# Patient Record
Sex: Female | Born: 1945 | Race: Black or African American | Hispanic: No | Marital: Married | Smoking: Never smoker
Health system: Southern US, Community
[De-identification: ages and names within clinical notes are randomized; demographics above are authoritative.]

## PROBLEM LIST (undated history)

## (undated) DIAGNOSIS — Z800 Family history of malignant neoplasm of digestive organs: Secondary | ICD-10-CM

## (undated) DIAGNOSIS — Z8042 Family history of malignant neoplasm of prostate: Secondary | ICD-10-CM

## (undated) DIAGNOSIS — Z803 Family history of malignant neoplasm of breast: Secondary | ICD-10-CM

## (undated) DIAGNOSIS — K635 Polyp of colon: Secondary | ICD-10-CM

## (undated) DIAGNOSIS — I10 Essential (primary) hypertension: Secondary | ICD-10-CM

## (undated) DIAGNOSIS — J45909 Unspecified asthma, uncomplicated: Secondary | ICD-10-CM

## (undated) DIAGNOSIS — E119 Type 2 diabetes mellitus without complications: Secondary | ICD-10-CM

## (undated) HISTORY — DX: Family history of malignant neoplasm of prostate: Z80.42

## (undated) HISTORY — DX: Family history of malignant neoplasm of breast: Z80.3

## (undated) HISTORY — DX: Family history of malignant neoplasm of digestive organs: Z80.0

## (undated) HISTORY — DX: Polyp of colon: K63.5

---

## 2008-03-07 ENCOUNTER — Ambulatory Visit: Payer: Self-pay | Admitting: Internal Medicine

## 2011-10-19 ENCOUNTER — Ambulatory Visit: Payer: Self-pay

## 2011-10-25 ENCOUNTER — Ambulatory Visit: Payer: Self-pay

## 2013-03-01 DIAGNOSIS — H269 Unspecified cataract: Secondary | ICD-10-CM | POA: Insufficient documentation

## 2013-03-01 DIAGNOSIS — M1710 Unilateral primary osteoarthritis, unspecified knee: Secondary | ICD-10-CM | POA: Insufficient documentation

## 2013-03-01 DIAGNOSIS — H409 Unspecified glaucoma: Secondary | ICD-10-CM | POA: Insufficient documentation

## 2015-07-04 ENCOUNTER — Ambulatory Visit: Payer: Medicare Other

## 2015-07-04 ENCOUNTER — Ambulatory Visit
Admission: EM | Admit: 2015-07-04 | Discharge: 2015-07-04 | Disposition: A | Discharge status: Home / Self Care | Payer: Medicare Other | Attending: Family Medicine | Admitting: Family Medicine

## 2015-07-04 DIAGNOSIS — J45909 Unspecified asthma, uncomplicated: Secondary | ICD-10-CM | POA: Insufficient documentation

## 2015-07-04 DIAGNOSIS — I10 Essential (primary) hypertension: Secondary | ICD-10-CM | POA: Insufficient documentation

## 2015-07-04 DIAGNOSIS — S93401A Sprain of unspecified ligament of right ankle, initial encounter: Secondary | ICD-10-CM

## 2015-07-04 DIAGNOSIS — Z7982 Long term (current) use of aspirin: Secondary | ICD-10-CM | POA: Diagnosis not present

## 2015-07-04 DIAGNOSIS — S82201A Unspecified fracture of shaft of right tibia, initial encounter for closed fracture: Secondary | ICD-10-CM

## 2015-07-04 DIAGNOSIS — E119 Type 2 diabetes mellitus without complications: Secondary | ICD-10-CM | POA: Insufficient documentation

## 2015-07-04 DIAGNOSIS — Y939 Activity, unspecified: Secondary | ICD-10-CM | POA: Diagnosis not present

## 2015-07-04 DIAGNOSIS — S99921A Unspecified injury of right foot, initial encounter: Secondary | ICD-10-CM | POA: Diagnosis present

## 2015-07-04 DIAGNOSIS — Z79899 Other long term (current) drug therapy: Secondary | ICD-10-CM | POA: Insufficient documentation

## 2015-07-04 HISTORY — DX: Essential (primary) hypertension: I10

## 2015-07-04 HISTORY — DX: Type 2 diabetes mellitus without complications: E11.9

## 2015-07-04 HISTORY — DX: Unspecified asthma, uncomplicated: J45.909

## 2015-07-04 NOTE — Discharge Instructions (Signed)
Acute Ankle Sprain with Phase I Rehab An acute ankle sprain is a partial or complete tear in one or more of the ligaments of the ankle due to traumatic injury. The severity of the injury depends on both the number of ligaments sprained and the grade of sprain. There are 3 grades of sprains.   A grade 1 sprain is a mild sprain. There is a slight pull without obvious tearing. There is no loss of strength, and the muscle and ligament are the correct length.  A grade 2 sprain is a moderate sprain. There is tearing of fibers within the substance of the ligament where it connects two bones or two cartilages. The length of the ligament is increased, and there is usually decreased strength.  A grade 3 sprain is a complete rupture of the ligament and is uncommon. In addition to the grade of sprain, there are three types of ankle sprains.  Lateral ankle sprains: This is a sprain of one or more of the three ligaments on the outer side (lateral) of the ankle. These are the most common sprains. Medial ankle sprains: There is one large triangular ligament of the inner side (medial) of the ankle that is susceptible to injury. Medial ankle sprains are less common. Syndesmosis, "high ankle," sprains: The syndesmosis is the ligament that connects the two bones of the lower leg. Syndesmosis sprains usually only occur with very severe ankle sprains. SYMPTOMS  Pain, tenderness, and swelling in the ankle, starting at the side of injury that may progress to the whole ankle and foot with time.  "Pop" or tearing sensation at the time of injury.  Bruising that may spread to the heel.  Impaired ability to walk soon after injury. CAUSES   Acute ankle sprains are caused by trauma placed on the ankle that temporarily forces or pries the anklebone (talus) out of its normal socket.  Stretching or tearing of the ligaments that normally hold the joint in place (usually due to a twisting injury). RISK INCREASES  WITH:  Previous ankle sprain.  Sports in which the foot may land awkwardly (i.e., basketball, volleyball, or soccer) or walking or running on uneven or rough surfaces.  Shoes with inadequate support to prevent sideways motion when stress occurs.  Poor strength and flexibility.  Poor balance skills.  Contact sports. PREVENTION   Warm up and stretch properly before activity.  Maintain physical fitness:  Ankle and leg flexibility, muscle strength, and endurance.  Cardiovascular fitness.  Balance training activities.  Use proper technique and have a coach correct improper technique.  Taping, protective strapping, bracing, or high-top tennis shoes may help prevent injury. Initially, tape is best; however, it loses most of its support function within 10 to 15 minutes.  Wear proper-fitted protective shoes (High-top shoes with taping or bracing is more effective than either alone).  Provide the ankle with support during sports and practice activities for 12 months following injury. PROGNOSIS   If treated properly, ankle sprains can be expected to recover completely; however, the length of recovery depends on the degree of injury.  A grade 1 sprain usually heals enough in 5 to 7 days to allow modified activity and requires an average of 6 weeks to heal completely.  A grade 2 sprain requires 6 to 10 weeks to heal completely.  A grade 3 sprain requires 12 to 16 weeks to heal.  A syndesmosis sprain often takes more than 3 months to heal. RELATED COMPLICATIONS   Frequent recurrence of symptoms may  result in a chronic problem. Appropriately addressing the problem the first time decreases the frequency of recurrence and optimizes healing time. Severity of the initial sprain does not predict the likelihood of later instability.  Injury to other structures (bone, cartilage, or tendon).  A chronically unstable or arthritic ankle joint is a possibility with repeated  sprains. TREATMENT Treatment initially involves the use of ice, medication, and compression bandages to help reduce pain and inflammation. Ankle sprains are usually immobilized in a walking cast or boot to allow for healing. Crutches may be recommended to reduce pressure on the injury. After immobilization, strengthening and stretching exercises may be necessary to regain strength and a full range of motion. Surgery is rarely needed to treat ankle sprains. MEDICATION   Nonsteroidal anti-inflammatory medications, such as aspirin and ibuprofen (do not take for the first 3 days after injury or within 7 days before surgery), or other minor pain relievers, such as acetaminophen, are often recommended. Take these as directed by your caregiver. Contact your caregiver immediately if any bleeding, stomach upset, or signs of an allergic reaction occur from these medications.  Ointments applied to the skin may be helpful.  Pain relievers may be prescribed as necessary by your caregiver. Do not take prescription pain medication for longer than 4 to 7 days. Use only as directed and only as much as you need. HEAT AND COLD  Cold treatment (icing) is used to relieve pain and reduce inflammation for acute and chronic cases. Cold should be applied for 10 to 15 minutes every 2 to 3 hours for inflammation and pain and immediately after any activity that aggravates your symptoms. Use ice packs or an ice massage.  Heat treatment may be used before performing stretching and strengthening activities prescribed by your caregiver. Use a heat pack or a warm soak. SEEK IMMEDIATE MEDICAL CARE IF:   Pain, swelling, or bruising worsens despite treatment.  You experience pain, numbness, discoloration, or coldness in the foot or toes.  New, unexplained symptoms develop (drugs used in treatment may produce side effects.) EXERCISES  PHASE I EXERCISES RANGE OF MOTION (ROM) AND STRETCHING EXERCISES - Ankle Sprain, Acute Phase I,  Weeks 1 to 2 These exercises may help you when beginning to restore flexibility in your ankle. You will likely work on these exercises for the 1 to 2 weeks after your injury. Once your physician, physical therapist, or athletic trainer sees adequate progress, he or she will advance your exercises. While completing these exercises, remember:   Restoring tissue flexibility helps normal motion to return to the joints. This allows healthier, less painful movement and activity.  An effective stretch should be held for at least 30 seconds.  A stretch should never be painful. You should only feel a gentle lengthening or release in the stretched tissue. RANGE OF MOTION - Dorsi/Plantar Flexion  While sitting with your right / left knee straight, draw the top of your foot upwards by flexing your ankle. Then reverse the motion, pointing your toes downward.  Hold each position for __________ seconds.  After completing your first set of exercises, repeat this exercise with your knee bent. Repeat __________ times. Complete this exercise __________ times per day.  RANGE OF MOTION - Ankle Alphabet  Imagine your right / left big toe is a pen.  Keeping your hip and knee still, write out the entire alphabet with your "pen." Make the letters as large as you can without increasing any discomfort. Repeat __________ times. Complete this exercise __________  times per day.  STRENGTHENING EXERCISES - Ankle Sprain, Acute -Phase I, Weeks 1 to 2 These exercises may help you when beginning to restore strength in your ankle. You will likely work on these exercises for 1 to 2 weeks after your injury. Once your physician, physical therapist, or athletic trainer sees adequate progress, he or she will advance your exercises. While completing these exercises, remember:   Muscles can gain both the endurance and the strength needed for everyday activities through controlled exercises.  Complete these exercises as instructed by  your physician, physical therapist, or athletic trainer. Progress the resistance and repetitions only as guided.  You may experience muscle soreness or fatigue, but the pain or discomfort you are trying to eliminate should never worsen during these exercises. If this pain does worsen, stop and make certain you are following the directions exactly. If the pain is still present after adjustments, discontinue the exercise until you can discuss the trouble with your clinician. STRENGTH - Dorsiflexors  Secure a rubber exercise band/tubing to a fixed object (i.e., table, pole) and loop the other end around your right / left foot.  Sit on the floor facing the fixed object. The band/tubing should be slightly tense when your foot is relaxed.  Slowly draw your foot back toward you using your ankle and toes.  Hold this position for __________ seconds. Slowly release the tension in the band and return your foot to the starting position. Repeat __________ times. Complete this exercise __________ times per day.  STRENGTH - Plantar-flexors   Sit with your right / left leg extended. Holding onto both ends of a rubber exercise band/tubing, loop it around the ball of your foot. Keep a slight tension in the band.  Slowly push your toes away from you, pointing them downward.  Hold this position for __________ seconds. Return slowly, controlling the tension in the band/tubing. Repeat __________ times. Complete this exercise __________ times per day.  STRENGTH - Ankle Eversion  Secure one end of a rubber exercise band/tubing to a fixed object (table, pole). Loop the other end around your foot just before your toes.  Place your fists between your knees. This will focus your strengthening at your ankle.  Drawing the band/tubing across your opposite foot, slowly, pull your little toe out and up. Make sure the band/tubing is positioned to resist the entire motion.  Hold this position for __________ seconds. Have  your muscles resist the band/tubing as it slowly pulls your foot back to the starting position.  Repeat __________ times. Complete this exercise __________ times per day.  STRENGTH - Ankle Inversion  Secure one end of a rubber exercise band/tubing to a fixed object (table, pole). Loop the other end around your foot just before your toes.  Place your fists between your knees. This will focus your strengthening at your ankle.  Slowly, pull your big toe up and in, making sure the band/tubing is positioned to resist the entire motion.  Hold this position for __________ seconds.  Have your muscles resist the band/tubing as it slowly pulls your foot back to the starting position. Repeat __________ times. Complete this exercises __________ times per day.  STRENGTH - Towel Curls  Sit in a chair positioned on a non-carpeted surface.  Place your right / left foot on a towel, keeping your heel on the floor.  Pull the towel toward your heel by only curling your toes. Keep your heel on the floor.  If instructed by your physician, physical therapist,   or athletic trainer, add weight to the end of the towel. Repeat __________ times. Complete this exercise __________ times per day. Document Released: 06/16/2005 Document Revised: 04/01/2014 Document Reviewed: 02/27/2009 [facility] Patient Information 2015 [place], [place]. This information is not intended to replace advice given to you by your health care provider. Make sure you discuss any questions you have with your health care provider. Tibial Fracture, Adult You have a fracture (break in bone) of your tibia. This is the large "shin" bone in your lower leg. These fractures are easily diagnosed with x-rays. TREATMENT  You have a simple fracture which usually will heal without disability. It can be treated with simple immobilization. This means the bone can be held with a cast or splint in a favorable position until your caregiver feels it is stable  (healed well enough). Then you can begin range of motion exercises to keep your knee and ankle limber (moving well). HOME CARE INSTRUCTIONS   Apply ice to the injury for 15-20 minutes, 03-04 times per day while awake, for 2 days. Put the ice in a plastic bag and place a thin towel between the bag of ice and your cast.  If you have a plaster or fiberglass cast:  Do not try to scratch the skin under the cast using sharp or pointed objects.  Check the skin around the cast every day. You may put lotion on any red or sore areas.  Keep your cast dry and clean.  If you have a plaster splint:  Wear the splint as directed.  You may loosen the elastic around the splint if your toes become numb, tingle, or turn cold or blue.  Do not put pressure on any part of your cast or splint until it is fully hardened.  Your cast or splint can be protected during bathing with a plastic bag. Do not lower the cast or splint into water.  Use crutches as directed.  Only take over-the-counter or prescription medicines for pain, discomfort, or fever as directed by your caregiver.  See your caregiver as directed. It is very important to keep all follow-up referrals and appointments in order to avoid any long-term problems with your leg and ankle including chronic pain, inability to move the ankle normally, failure of the fracture to heal and permanent disability. SEEK IMMEDIATE MEDICAL CARE IF:   Pain is becoming worse rather than better, or if pain is uncontrolled with medications.  You have increased swelling, pain, or redness in the foot.  You begin to lose feeling in your foot or toes.  You develop a cold or blue foot or toes on the injured side.  You develop severe pain in your injured leg, especially if it is increased with movement of your toes. Document Released: 08/10/2001 Document Revised: 02/07/2012 Document Reviewed: 01/09/2014 [facility] Patient Information 2015 [place], [place]. This information  is not intended to replace advice given to you by your health care provider. Make sure you discuss any questions you have with your health care provider.

## 2015-07-04 NOTE — ED Provider Notes (Signed)
CSN: [id number]     Arrival date & time 07/04/15  1308 History   First MD Initiated Contact with Patient 07/04/15 1330     Chief Complaint  Patient presents with  . Foot Injury   (Consider location/radiation/quality/duration/timing/severity/associated sxs/prior Treatment) HPI Comments: Married african [place] female here with spouse for evaluation of rolling right ankle 1 Aug when making the bed.  Has been resting, elevating, soaking in epsom salts, tylenol and OTC pain patch from [employer] providing some relief until she bears weight.  Limping and swelling right ankle.  Hx Dm, asthma, HTN  Patient is a 69 y.o. female presenting with foot injury. The history is provided by the patient and the spouse.  Foot Injury Location:  Ankle Time since incident:  5 days Injury: yes   Ankle location:  R ankle Pain details:    Quality:  Aching   Radiates to:  Does not radiate   Severity:  Moderate   Onset quality:  Sudden   Duration:  5 days   Timing:  Intermittent Chronicity:  New Foreign body present:  No foreign bodies Relieved by:  Rest (epsom soak; OTC pain patch from [employer]) Worsened by:  Bearing weight Ineffective treatments:  Acetaminophen Associated symptoms: decreased ROM, stiffness and swelling   Associated symptoms: no back pain, no fatigue, no fever, no itching, no muscle weakness, no neck pain, no numbness and no tingling   Risk factors: no concern for non-accidental trauma, no frequent fractures, no known bone disorder, no obesity and no recent illness     Past Medical History  Diagnosis Date  . Hypertension   . Asthma   . Diabetes mellitus without complication    History reviewed. No pertinent past surgical history. No family history on file. History  Substance Use Topics  . Smoking status: Never Smoker   . Smokeless tobacco: Not on file  . Alcohol Use: No   OB History    No data available     Review of Systems  Constitutional: Negative for fever, chills,  diaphoresis, activity change, appetite change and fatigue.  HENT: Negative for congestion, dental problem, drooling, ear discharge, ear pain and facial swelling.   Eyes: Negative for photophobia, pain, discharge, redness, itching and visual disturbance.  Respiratory: Negative for cough, shortness of breath, wheezing and stridor.   Cardiovascular: Negative for chest pain, palpitations and leg swelling.  Gastrointestinal: Negative for nausea, vomiting, abdominal pain, diarrhea and constipation.  Endocrine: Negative for cold intolerance and heat intolerance.  Genitourinary: Negative for dysuria, hematuria and difficulty urinating.  Musculoskeletal: Positive for myalgias, joint swelling, gait problem and stiffness. Negative for back pain, arthralgias, neck pain and neck stiffness.  Skin: Negative for color change, itching, pallor, rash and wound.  Allergic/Immunologic: Positive for environmental allergies. Negative for food allergies.  Neurological: Negative for dizziness, tremors, facial asymmetry, weakness and headaches.  Hematological: Negative for adenopathy. Does not bruise/bleed easily.  Psychiatric/Behavioral: Negative for behavioral problems, confusion, sleep disturbance and agitation.    Allergies  Review of patient's allergies indicates no known allergies.  Home Medications   Prior to Admission medications   Medication Sig Start Date End Date Taking? Authorizing Provider  acetaminophen (TYLENOL) 650 MG CR tablet Take 650 mg by mouth every 8 (eight) hours as needed for pain.   Yes Historical Provider, MD  aspirin 81 MG tablet Take 81 mg by mouth daily.   Yes Historical Provider, MD  atenolol (TENORMIN) 25 MG tablet Take by mouth daily.   Yes Historical Provider, MD  Biotin 5000 MCG CAPS Take by mouth.   Yes Historical Provider, MD  fluticasone (FLONASE) 50 MCG/ACT nasal spray Place into both nostrils daily.   Yes Historical Provider, MD  metFORMIN (GLUCOPHAGE) 1000 MG tablet Take 500  mg by mouth 2 (two) times daily with a meal.   Yes Historical Provider, MD  Omega-3 Fatty Acids (FISH OIL) 1200 MG CPDR Take by mouth.   Yes Historical Provider, MD  omeprazole (PRILOSEC) 20 MG capsule Take 20 mg by mouth daily.   Yes Historical Provider, MD  timolol (BETIMOL) 0.25 % ophthalmic solution 1-2 drops 2 (two) times daily.   Yes Historical Provider, MD  triamterene-hydrochlorothiazide (MAXZIDE) 75-50 MG per tablet Take 1 tablet by mouth daily.   Yes Historical Provider, MD  Turmeric Curcumin 500 MG CAPS Take by mouth 2 (two) times daily.   Yes Historical Provider, MD   BP 158/64 mmHg  Pulse 70  Temp(Src) 98.4 F (36.9 C) (Tympanic)  Resp 16  Ht 5\' 2"  (1.575 m)  Wt 201 lb (91.173 kg)  BMI 36.75 kg/m2  SpO2 98% Physical Exam  Constitutional: She is oriented to person, place, and time. Vital signs are normal. She appears well-developed and well-nourished. No distress.  HENT:  Head: Normocephalic and atraumatic.  Right Ear: External ear normal.  Left Ear: External ear normal.  Nose: Nose normal.  Mouth/Throat: Oropharynx is clear and moist. No oropharyngeal exudate.  Eyes: Conjunctivae, EOM and lids are normal. Pupils are equal, round, and reactive to light. Right eye exhibits no discharge. Left eye exhibits no discharge. No scleral icterus.  Neck: Trachea normal and normal range of motion. Neck supple. No tracheal deviation present.  Cardiovascular: Normal rate, regular rhythm and intact distal pulses.   Pulmonary/Chest: Effort normal and breath sounds normal. No stridor. No respiratory distress. She has no wheezes. She has no rales.  Abdominal: Soft. She exhibits no distension.  Musculoskeletal: She exhibits edema and tenderness.       Right hip: Normal.       Left hip: Normal.       Right knee: Normal.       Left knee: Normal.       Right ankle: She exhibits decreased range of motion and swelling. She exhibits no ecchymosis, no deformity, no laceration and normal pulse.  Tenderness. Lateral malleolus tenderness found. No medial malleolus and no proximal fibula tenderness found. Achilles tendon normal. Achilles tendon exhibits no pain, no defect and normal Thompson's test results.       Left ankle: Normal.       Right upper leg: Normal.       Left upper leg: Normal.       Left lower leg: Normal.       Right foot: Normal.       Left foot: Normal.       Feet:  Lymphadenopathy:    She has no cervical adenopathy.  Neurological: She is alert and oriented to person, place, and time. She exhibits normal muscle tone. Coordination normal.  Skin: Skin is warm, dry and intact. No rash noted. She is not diaphoretic. No erythema. No pallor.  Psychiatric: She has a normal mood and affect. Her speech is normal and behavior is normal. Judgment and thought content normal. Cognition and memory are normal.  Nursing note and vitals reviewed.   ED Course  Procedures (including critical care time) Labs Review Labs Reviewed - No data to display  Imaging Review Dg Ankle Complete Right  07/04/2015  CLINICAL DATA:  Twisted right ankle 2 days ago  EXAM: RIGHT ANKLE - COMPLETE 3+ VIEW  COMPARISON:  07/04/2015 right foot  FINDINGS: Three views of the right ankle submitted. There is subtle nondisplaced fracture in distal left fibula. There is soft tissue swelling adjacent to lateral malleolus.  IMPRESSION: Subtle nondisplaced fracture in distal left fibula. Soft tissue swelling adjacent to lateral malleolus.   Electronically Signed   By: Lahoma Crocker M.D.   On: 07/04/2015 14:10   Dg Foot Complete Right  07/04/2015   CLINICAL DATA:  Twisting injury 4 days ago.  Pain  EXAM: RIGHT FOOT COMPLETE - 3+ VIEW  COMPARISON:  None.  FINDINGS: Negative for fracture. Mild degenerative change in the midfoot. Mild degenerative change in the first metatarsal phalangeal joint.  IMPRESSION: Negative for fracture.   Electronically Signed   By: Franchot Gallo M.D.   On: 07/04/2015 14:08     MDM   1.  Ankle sprain, right, initial encounter   2. Tibia fracture, right, closed, initial encounter   Plan: 1. Test/x-ray results and diagnosis reviewed with patient and spouse.  Given copy radiology report 2. rx as per orders; risks, benefits, potential side effects reviewed with patient and spouse 3. Recommend supportive treatment with tylenol, ice, camboot, crutches, elevation, rest 4. F/u prn if symptoms worsen or don't improve. Schedule orthopedics appt [facility]  Patient was instructed to rest, ice and elevate the ankle as much as possible.  Activity as tolerated.  Crutches, limited weight bearing, cam boot.  Discussed results and given copy of radiology report.  Patient is to take acetaminophen 1000mg  po QID prn.  Discussed at risk to reinjure ankle over the next year and to wear supportive footwear/ankle sleeve/ace bandage.  Follow up with orthopedics and [facility] exitcare handout on ankle sprain with rehab exercises and tibia fracture.  Spouse and Patient verbalized agreement and understanding of treatment plan and had no further questions at this time.   P2:  Injury Prevention and Fitness.    Olen Cordial, NP 07/04/15 1428

## 2015-07-04 NOTE — ED Notes (Signed)
Pt states "I twisted my right foot Monday and now I have pain and swelling."

## 2015-07-15 DIAGNOSIS — K2950 Unspecified chronic gastritis without bleeding: Secondary | ICD-10-CM | POA: Insufficient documentation

## 2015-07-15 DIAGNOSIS — B9681 Helicobacter pylori [H. pylori] as the cause of diseases classified elsewhere: Secondary | ICD-10-CM | POA: Insufficient documentation

## 2015-11-13 IMAGING — CR DG ANKLE COMPLETE 3+V*R*
3 series · 3 of 3 positions shown · non-contrast
Comparison: 07/04/2015 right foot

CLINICAL DATA: Twisted right ankle 2 days ago

EXAM:
RIGHT ANKLE - COMPLETE 3+ VIEW

[ankle ap]
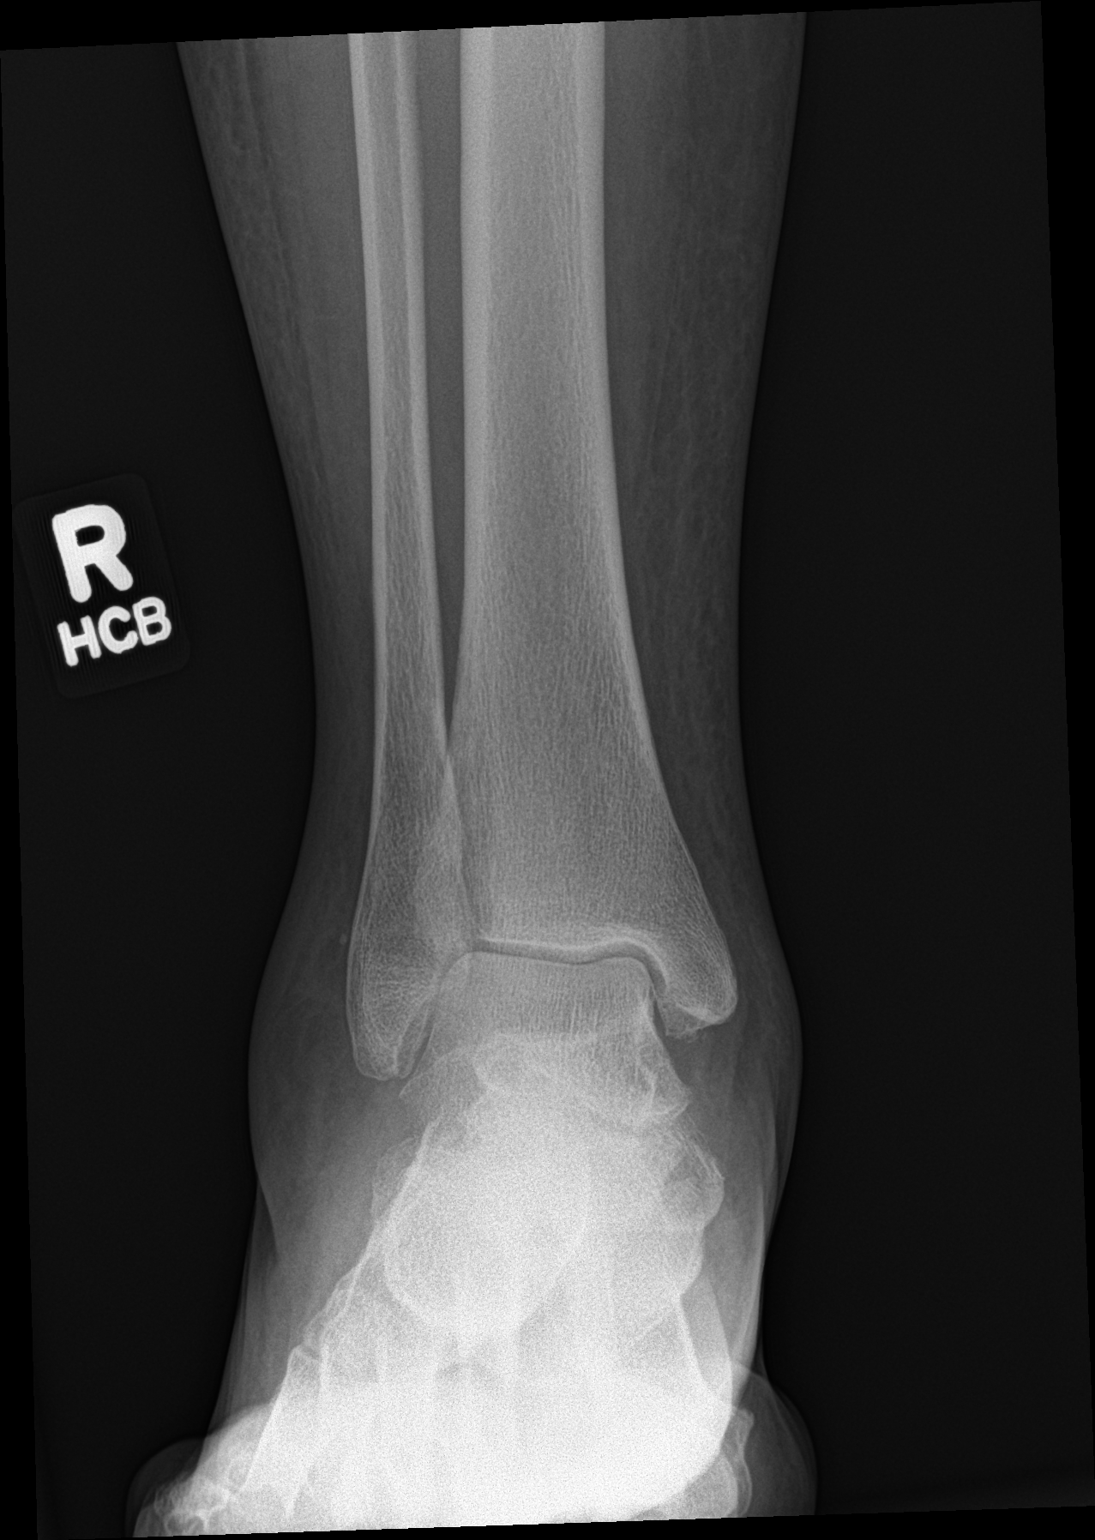

[ankle obl]
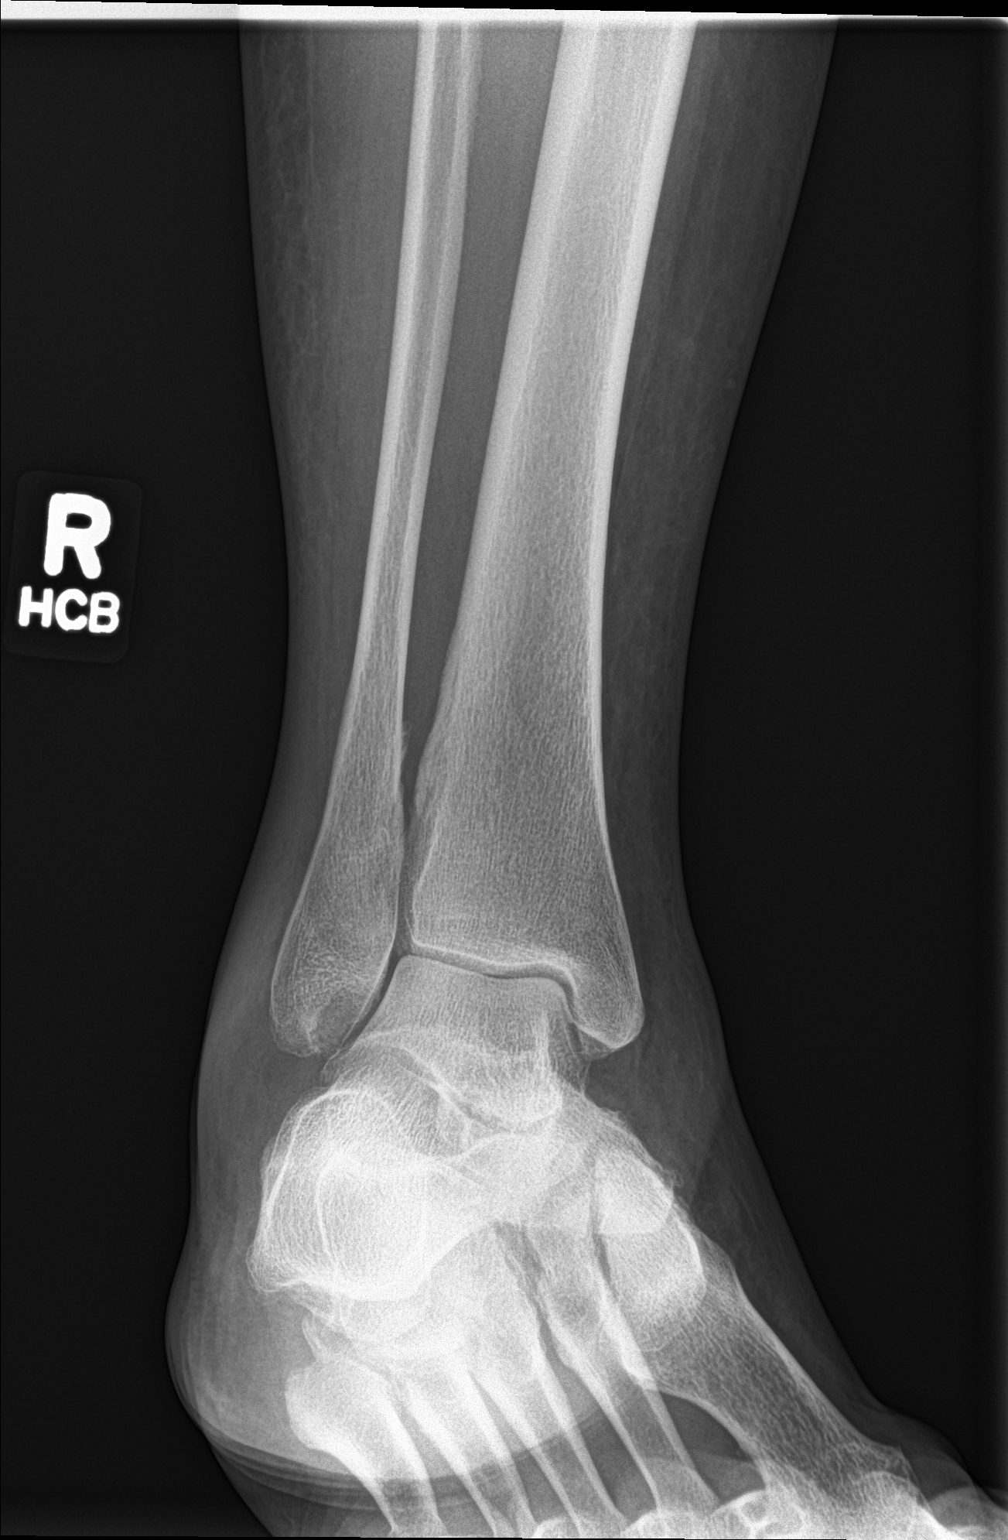

[ankle lat]
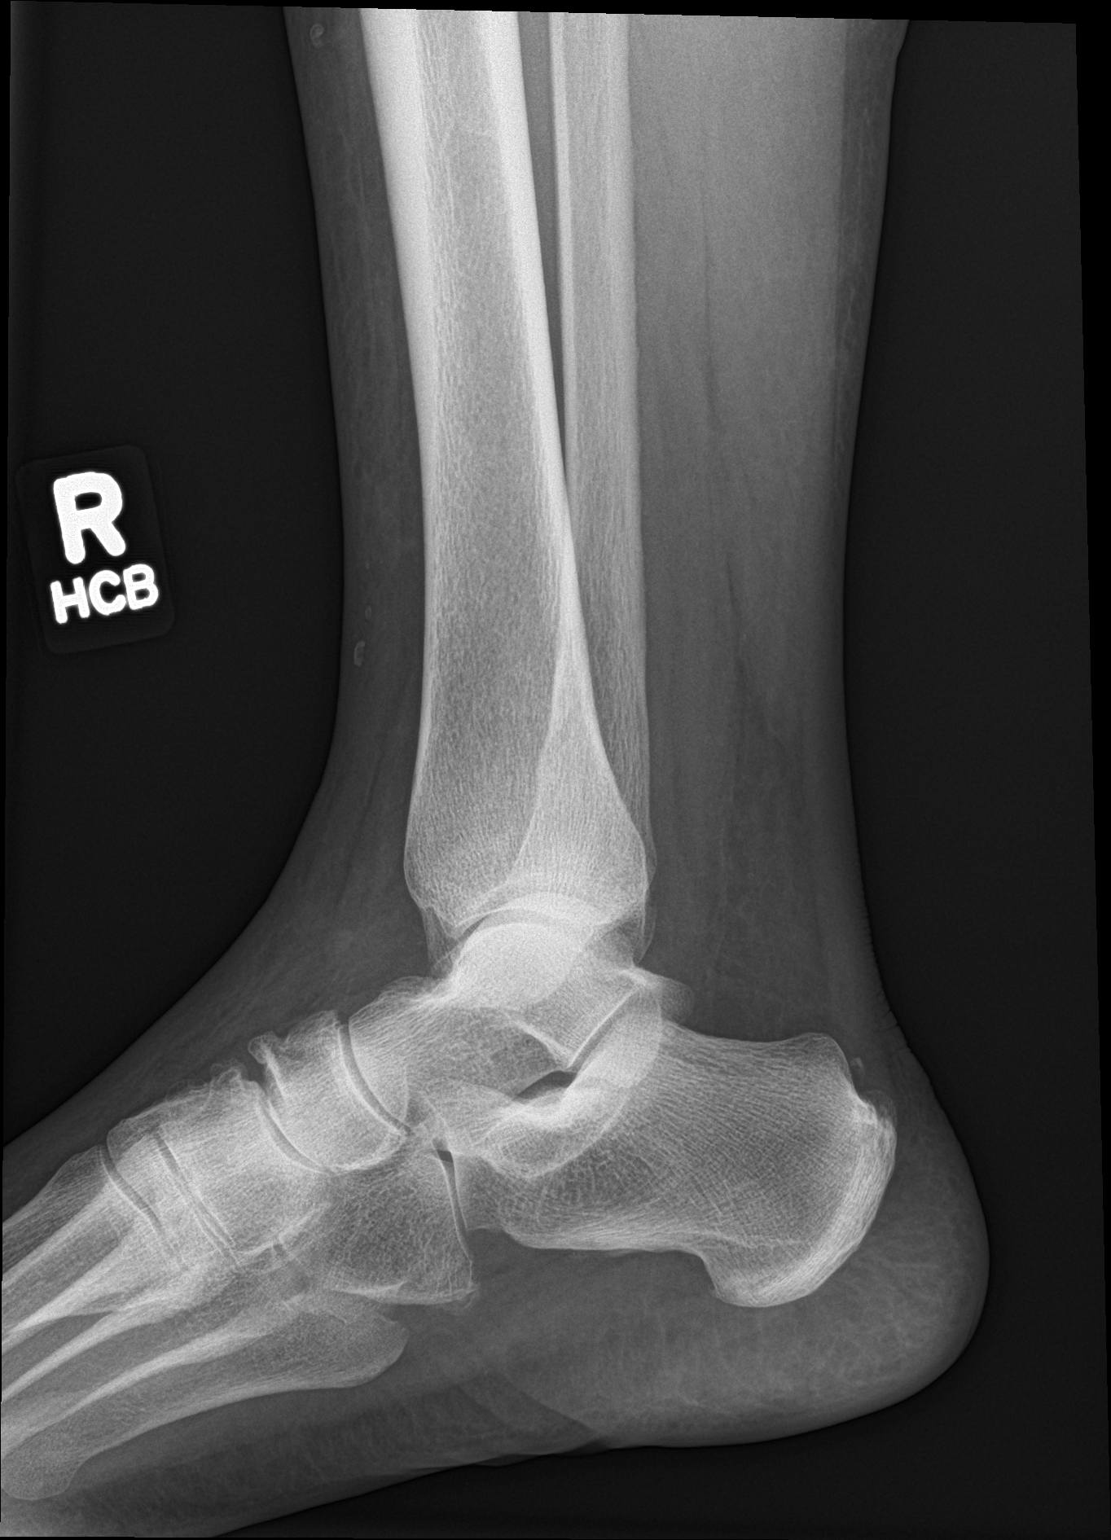

[3 of 3 positions shown; findings below may reference images not displayed]

FINDINGS: Three views of the right ankle submitted. There is subtle
nondisplaced fracture in distal left fibula. There is soft tissue
swelling adjacent to lateral malleolus.
IMPRESSION: Subtle nondisplaced fracture in distal left fibula. Soft tissue
swelling adjacent to lateral malleolus.

## 2015-12-15 ENCOUNTER — Ambulatory Visit
Admission: EM | Admit: 2015-12-15 | Discharge: 2015-12-15 | Disposition: A | Discharge status: Home / Self Care | Payer: Medicare Other | Attending: Family Medicine | Admitting: Family Medicine

## 2015-12-15 ENCOUNTER — Ambulatory Visit (INDEPENDENT_AMBULATORY_CARE_PROVIDER_SITE_OTHER): Payer: Medicare Other

## 2015-12-15 ENCOUNTER — Encounter: Payer: Self-pay | Admitting: Emergency Medicine

## 2015-12-15 DIAGNOSIS — M79672 Pain in left foot: Secondary | ICD-10-CM

## 2015-12-15 NOTE — ED Provider Notes (Signed)
CSN: [id number]     Arrival date & time 12/15/15  1123 History   First MD Initiated Contact with Patient 12/15/15 1332     Chief Complaint  Patient presents with  . Foot Pain   (Consider location/radiation/quality/duration/timing/severity/associated sxs/prior Treatment) HPI  70 year old female presents with left lateral foot swelling and pain for a couple of weeks.. She states that she's had no injury to her foot that she recollects. Is just completed physical therapy on her right foot for fracture that she had and was managed by Dr. Vickki Muff. Since it is continually to become more worse and noticed significant swelling over her inferior malleolus laterally and at the base of her fifth metatarsal.       Past Medical History  Diagnosis Date  . Hypertension   . Asthma   . Diabetes mellitus without complication ([place])    History reviewed. No pertinent past surgical history. History reviewed. No pertinent family history. Social History  Substance Use Topics  . Smoking status: Never Smoker   . Smokeless tobacco: None  . Alcohol Use: No   OB History    No data available     Review of Systems  Constitutional: Positive for activity change. Negative for fever, chills, diaphoresis and fatigue.  Musculoskeletal: Positive for myalgias.  Skin: Negative for color change and pallor.  All other systems reviewed and are negative.   Allergies  Review of patient's allergies indicates no known allergies.  Home Medications   Prior to Admission medications   Medication Sig Start Date End Date Taking? Authorizing Provider  acetaminophen (TYLENOL) 650 MG CR tablet Take 650 mg by mouth every 8 (eight) hours as needed for pain.    Historical Provider, MD  aspirin 81 MG tablet Take 81 mg by mouth daily.    Historical Provider, MD  atenolol (TENORMIN) 25 MG tablet Take by mouth daily.    Historical Provider, MD  Biotin 5000 MCG CAPS Take by mouth.    Historical Provider, MD  fluticasone  (FLONASE) 50 MCG/ACT nasal spray Place into both nostrils daily.    Historical Provider, MD  metFORMIN (GLUCOPHAGE) 1000 MG tablet Take 500 mg by mouth 2 (two) times daily with a meal.    Historical Provider, MD  Omega-3 Fatty Acids (FISH OIL) 1200 MG CPDR Take by mouth.    Historical Provider, MD  omeprazole (PRILOSEC) 20 MG capsule Take 20 mg by mouth daily.    Historical Provider, MD  timolol (BETIMOL) 0.25 % ophthalmic solution 1-2 drops 2 (two) times daily.    Historical Provider, MD  triamterene-hydrochlorothiazide (MAXZIDE) 75-50 MG per tablet Take 1 tablet by mouth daily.    Historical Provider, MD  Turmeric Curcumin 500 MG CAPS Take by mouth 2 (two) times daily.    Historical Provider, MD   Meds Ordered and Administered this Visit  Medications - No data to display  BP 143/58 mmHg  Pulse 60  Temp(Src) 97.3 F (36.3 C) (Tympanic)  Resp 16  Ht 5\' 2"  (1.575 m)  Wt 200 lb (90.719 kg)  BMI 36.57 kg/m2  SpO2 100% No data found.   Physical Exam  Constitutional: She is oriented to person, place, and time. She appears well-developed and well-nourished. No distress.  HENT:  Head: Normocephalic and atraumatic.  Eyes: Conjunctivae are normal. Pupils are equal, round, and reactive to light.  Neck: Normal range of motion. Neck supple.  Musculoskeletal: Normal range of motion. She exhibits edema and tenderness.  Examination of the left foot and ankle reveals  edema over the lateral malleolus that is pitting and also swelling over the base of the fourth and fifth metatarsals. This is somewhat pitting. She is fairly good range of motion preserved today. Maximum tenderness is sharply localized over the base of the fifth metatarsal.  Neurological: She is alert and oriented to person, place, and time.  Skin: Skin is warm and dry. She is not diaphoretic.  Psychiatric: She has a normal mood and affect. Her behavior is normal. Judgment and thought content normal.  Nursing note and vitals  reviewed.   ED Course  Procedures (including critical care time)  Labs Review Labs Reviewed - No data to display  Imaging Review Dg Foot Complete Left  12/15/2015  CLINICAL DATA:  Left foot pain for the month of January, 2017. No recent injury. Initial encounter. EXAM: LEFT FOOT - COMPLETE 3+ VIEW COMPARISON:  None. FINDINGS: No acute bony or joint abnormality is identified. There is some calcaneal spurring present. Mild first MTP degenerative disease is noted. Soft tissues are unremarkable. IMPRESSION: No acute abnormality. Mild first MTP osteoarthritis. Calcaneal spurring. Electronically Signed   By: Inge Rise M.D.   On: 12/15/2015 14:08     Visual Acuity Review  Right Eye Distance:   Left Eye Distance:   Bilateral Distance:    Right Eye Near:   Left Eye Near:    Bilateral Near:         MDM   1. Acute pain of left foot    Plan: 1. Test/x-ray results and diagnosis reviewed with patient 2. rx as per orders; risks, benefits, potential side effects reviewed with patient 3. Recommend supportive treatment with ice elevation as necessary. And has been taken off of NSAIDs that she can only take Tylenol which I have said that that is okay. She states that she has a boot at home from previous injuries and she can use that to help with the pain and allow her to ambulate more freely. She does have an appointment week from today with Dr. Vickki Muff and she will keep that appointment. I have provided her with a disc of the x-rays. The meantime I have asked her to avoid symptoms as much as possible elevate to control the swelling. 4. F/u prn if symptoms worsen or don't improve     Lorin Picket, PA-C 12/15/15 1427

## 2015-12-15 NOTE — ED Notes (Signed)
Patient c/o swelling and pain in her left foot for the past 2 weeks.  Patient denies injury.

## 2016-01-29 ENCOUNTER — Other Ambulatory Visit: Payer: Self-pay | Admitting: Specialist

## 2016-01-29 DIAGNOSIS — M25572 Pain in left ankle and joints of left foot: Secondary | ICD-10-CM

## 2016-02-17 ENCOUNTER — Ambulatory Visit
Admission: RE | Admit: 2016-02-17 | Discharge: 2016-02-17 | Disposition: A | Discharge status: Home / Self Care | Payer: Medicare Other | Source: Ambulatory Visit | Attending: Specialist | Admitting: Specialist

## 2016-02-17 DIAGNOSIS — M25572 Pain in left ankle and joints of left foot: Secondary | ICD-10-CM | POA: Diagnosis not present

## 2016-06-14 ENCOUNTER — Encounter: Payer: Self-pay | Admitting: Genetic Counselor

## 2016-06-14 ENCOUNTER — Other Ambulatory Visit: Payer: Medicare Other

## 2016-06-14 ENCOUNTER — Ambulatory Visit (HOSPITAL_BASED_OUTPATIENT_CLINIC_OR_DEPARTMENT_OTHER): Payer: Self-pay | Admitting: Genetic Counselor

## 2016-06-14 DIAGNOSIS — Z800 Family history of malignant neoplasm of digestive organs: Secondary | ICD-10-CM

## 2016-06-14 DIAGNOSIS — K635 Polyp of colon: Secondary | ICD-10-CM

## 2016-06-14 DIAGNOSIS — Z8042 Family history of malignant neoplasm of prostate: Secondary | ICD-10-CM

## 2016-06-14 DIAGNOSIS — Z803 Family history of malignant neoplasm of breast: Secondary | ICD-10-CM

## 2016-06-14 NOTE — Progress Notes (Signed)
REFERRING PROVIDER: Marcial Pacas, MD [address] [place], [place] [address]  PRIMARY PROVIDER:  Marcial Pacas, MD  PRIMARY REASON FOR VISIT:  1. Colon polyps   2. Family history of stomach cancer   3. Family history of prostate cancer   4. Family history of breast cancer      HISTORY OF PRESENT ILLNESS:   Meagan Martinez, a 70 y.o. female, was seen for a [place] cancer genetics consultation at the request of Dr. Lenna Gilford due to a family history of cancer.  Meagan Martinez presents to clinic today to discuss the possibility of a hereditary predisposition to cancer, genetic testing, and to further clarify her future cancer risks, as well as potential cancer risks for family members.  Meagan Martinez is a 70 y.o. female with no personal history of cancer.  Meagan Martinez daughter was diagnosed with breast cancer and underwent genetic testing.  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  Therefore, testing parents was recommended by the laboratory.  CANCER HISTORY:   No history exists.     HORMONAL RISK FACTORS:  Menarche was at age 25-16.  First live birth at age 40.  OCP use for approximately 5-6 years.  Ovaries intact: yes.  Hysterectomy: no.  Menopausal status: postmenopausal.  HRT use: 0 years. Colonoscopy: yes; approximately 7 colon polyps in her lifetime. Mammogram within the last year: yes. Number of breast biopsies: 1. Up to date with pelvic exams:  yes. Any excessive radiation exposure in the past:  no  Past Medical History  Diagnosis Date  . Hypertension   . Asthma   . Diabetes mellitus without complication ([place])   . Family history of breast cancer   . Family history of prostate cancer   . Family history of stomach cancer   . Colon polyps     History  reviewed. No pertinent past surgical history.  Social History   Social History  . Marital Status: Married    Spouse Name: Jeneen Rinks  . Number of Children: 1  . Years of Education: N/A   Social History Main Topics  . Smoking status: Never Smoker   . Smokeless tobacco: None  . Alcohol Use: No  . Drug Use: None  . Sexual Activity: Not Asked   Other Topics Concern  . None   Social History Narrative     FAMILY HISTORY:  We obtained a detailed, 4-generation family history.  Significant diagnoses are listed below: Family History  Problem Relation Age of Onset  . Alzheimer's disease Mother   . Lung cancer Father     smoker  . Prostate cancer Brother   . Stomach cancer Paternal Aunt   . Prostate cancer Brother   . Stomach cancer Cousin     paternal first cousin    The patient has one daughter who has breast cancer.  She has four brothers, one sister and a paternal half sister.  The half sister has died from kidney disease.  Two brothers had prostate cancer.  The patient's mother died from Alzheimer's disease.  She had four maternal half siblings who were all cancer free.  Her father died from lung cancer.  He was a smoker.  He had three sisters.  One had stomach cancer and had a daughter with stomach cancer.  His parents were cancer free, but he had a paternal cousin who had breast cancer.  Patient's maternal ancestors are of [place] and [place] descent, and paternal ancestors are of [place] and [place] descent. There is no reported Ashkenazi Jewish ancestry. There is no known consanguinity.  GENETIC COUNSELING ASSESSMENT: Meagan Martinez is a 70 y.o. female with a personal history of colon polyps and family history of cancer which is somewhat suggestive of a possibly hereditary condition vs sporadic cancer and predisposition to cancer. We, therefore, discussed and recommended the following at today's visit.   DISCUSSION: We discussed that about 5-10% of cancer  is inherited.  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  To help classify this change, the laboratory wanted to test her parents.  Meagan Martinez husband was tested based on his personal history of cancer and family history of cancer, and was negative.  Therefore Meagan Martinez is here to undergo genetic testing.  We discussed that in all likelihood this is being inherited from her, since her husband was negative, however some people may be a de novo mutation. We will look for the presence or absence of this genetic change found in her daughter in her.  We reviewed the characteristics, features and inheritance patterns of hereditary cancer syndromes. We also discussed genetic testing, including the appropriate family members to test, the process of testing, insurance coverage and turn-around-time for results. We discussed the implications of a negative, positive and/or variant of uncertain significant result. We recommended Meagan Martinez pursue genetic testing for the PMS2 targeted testing through the variant testing program.  PLAN: After considering the risks, benefits, and limitations, Meagan Martinez  provided informed consent to pursue genetic testing and the blood sample was sent to [facility] for analysis of the PMS2 gene. Results should be available within approximately 2-3 weeks' time, at which point they will be disclosed by telephone to Meagan Martinez, as will any additional recommendations warranted by these results. Meagan Martinez will receive a summary of her genetic counseling visit and a copy of her results once available. This information will also be available in Epic. We encouraged Ms. Bobak to remain in contact with cancer genetics annually so that we can continuously update the family history and inform her of any changes in cancer genetics and testing that may be of benefit for her family. Meagan Martinez questions  were answered to her satisfaction today. Our contact information was provided should additional questions or concerns arise.  Lastly, we encouraged Meagan Martinez to remain in contact with cancer genetics annually so that we can continuously update the family history and inform her of any changes in cancer genetics and testing that may be of benefit for this family.   Ms.  T[place]on questions were answered to her satisfaction today. Our contact information was provided should additional questions or concerns arise. Thank you for the referral and allowing [place] to share in the care of your patient.   Bodee Lafoe P. Florene Glen, [place], [facility] Certified Genetic Counselor [email or web address] phone: [phone]  The patient was seen for a total of 30 minutes in face-to-face genetic counseling.  This patient was discussed with Drs. Magrinat, Lindi Adie and/or Burr Medico who agrees with the above.    _______________________________________________________________________ For Office Staff:  Number of people involved in session: 2 Was an Intern/ student involved with case: no

## 2016-07-09 ENCOUNTER — Telehealth: Payer: Self-pay | Admitting: Genetic Counselor

## 2016-07-09 ENCOUNTER — Encounter: Payer: Self-pay | Admitting: Genetic Counselor

## 2016-07-09 DIAGNOSIS — Z1379 Encounter for other screening for genetic and chromosomal anomalies: Secondary | ICD-10-CM | POA: Insufficient documentation

## 2016-07-09 NOTE — Telephone Encounter (Signed)
Revealed to daughter that her testing helped reclassify daughter's previously questionable genetic testing to a negative test.

## 2016-10-08 ENCOUNTER — Encounter (HOSPITAL_COMMUNITY): Payer: Self-pay

## 2016-10-19 DIAGNOSIS — I809 Phlebitis and thrombophlebitis of unspecified site: Secondary | ICD-10-CM | POA: Insufficient documentation

## 2017-07-11 ENCOUNTER — Ambulatory Visit (INDEPENDENT_AMBULATORY_CARE_PROVIDER_SITE_OTHER): Payer: Medicare Other

## 2017-07-11 ENCOUNTER — Encounter: Payer: Self-pay | Admitting: Emergency Medicine

## 2017-07-11 ENCOUNTER — Ambulatory Visit
Admission: EM | Admit: 2017-07-11 | Discharge: 2017-07-11 | Disposition: A | Discharge status: Home / Self Care | Payer: Medicare Other | Attending: Family Medicine | Admitting: Family Medicine

## 2017-07-11 DIAGNOSIS — M25572 Pain in left ankle and joints of left foot: Secondary | ICD-10-CM

## 2017-07-11 DIAGNOSIS — M10072 Idiopathic gout, left ankle and foot: Secondary | ICD-10-CM | POA: Diagnosis not present

## 2017-07-11 MED ORDER — HYDROCODONE-ACETAMINOPHEN 5-325 MG PO TABS: ORAL_TABLET | ORAL | 0 refills | Status: AC

## 2017-07-11 MED ORDER — PREDNISONE 20 MG PO TABS: ORAL_TABLET | ORAL | 0 refills | Status: AC

## 2017-07-11 NOTE — ED Provider Notes (Signed)
[facility]    CSN: [id number] Arrival date & time: 07/11/17  0908     History   Chief Complaint Chief Complaint  Patient presents with  . Foot Pain    HPI Meagan Martinez is a 71 y.o. female.   71 yo female with a c/o left ankle pain for 4 days, associated with some mild swelling.  States pain started suddenly without any apparent cause. She does report falling 4 days prior to her ankle hurting but states she did not injure her ankle during that fall and did not have any ankle symptoms afterwards.    The history is provided by the patient.  Foot Pain     Past Medical History:  Diagnosis Date  . Asthma   . Colon polyps   . Diabetes mellitus without complication ([place])   . Family history of breast cancer   . Family history of prostate cancer   . Family history of stomach cancer   . Hypertension     Patient Active Problem List   Diagnosis Date Noted  . Genetic testing 07/09/2016  . Family history of breast cancer   . Family history of prostate cancer   . Family history of stomach cancer   . Colon polyps     History reviewed. No pertinent surgical history.  OB History    No data available       Home Medications    Prior to Admission medications   Medication Sig Start Date End Date Taking? Authorizing Provider  Potassium 99 MG TABS Take by mouth.   Yes [provider]  acetaminophen (TYLENOL) 650 MG CR tablet Take 650 mg by mouth every 8 (eight) hours as needed for pain.    [provider]  aspirin 81 MG tablet Take 81 mg by mouth daily.    [provider]  atenolol (TENORMIN) 25 MG tablet Take by mouth daily.    [provider]  Biotin 5000 MCG CAPS Take by mouth.    [provider]  fluticasone (FLONASE) 50 MCG/ACT nasal spray Place into both nostrils daily.    [provider]  HYDROcodone-acetaminophen (NORCO/VICODIN) 5-325 MG tablet 1-2 tabs po bid prn 07/11/17   Norval Gable, MD    metFORMIN (GLUCOPHAGE) 1000 MG tablet Take 500 mg by mouth 2 (two) times daily with a meal.    [provider]  Omega-3 Fatty Acids (FISH OIL) 1200 MG CPDR Take by mouth.    [provider]  omeprazole (PRILOSEC) 20 MG capsule Take 20 mg by mouth daily.    [provider]  predniSONE (DELTASONE) 20 MG tablet 2 tabs po qd x 5 days 07/11/17   Norval Gable, MD  timolol (BETIMOL) 0.25 % ophthalmic solution 1-2 drops 2 (two) times daily.    [provider]  triamterene-hydrochlorothiazide (MAXZIDE) 75-50 MG per tablet Take 1 tablet by mouth daily.    [provider]  Turmeric Curcumin 500 MG CAPS Take by mouth 2 (two) times daily.    [provider]    Family History Family History  Problem Relation Age of Onset  . Alzheimer's disease Mother   . Lung cancer Father        smoker  . Prostate cancer Brother   . Stomach cancer Paternal Aunt   . Prostate cancer Brother   . Stomach cancer Cousin        paternal first cousin    Social History Social History  Substance Use Topics  .  Smoking status: Never Smoker  . Smokeless tobacco: Never Used  . Alcohol use No     Allergies   Patient has no known allergies.   Review of Systems Review of Systems   Physical Exam Triage Vital Signs ED Triage Vitals  Enc Vitals Group     BP 07/11/17 0958 (!) 149/54     Pulse Rate 07/11/17 0958 72     Resp 07/11/17 0958 16     Temp 07/11/17 0958 98.2 F (36.8 C)     Temp Source 07/11/17 0958 Oral     SpO2 07/11/17 0958 100 %     Weight 07/11/17 0955 210 lb (95.3 kg)     Height 07/11/17 0955 5\' 2"  (1.575 m)     Head Circumference --      Peak Flow --      Pain Score 07/11/17 0955 10     Pain Loc --      Pain Edu? --      Excl. in [place]? --    No data found.   Updated Vital Signs BP (!) 149/54 (BP Location: Left Arm)   Pulse 72   Temp 98.2 F (36.8 C) (Oral)   Resp 16   Ht 5\' 2"  (1.575 m)   Wt 210 lb (95.3 kg)   SpO2 100%   BMI  38.41 kg/m   Visual Acuity Right Eye Distance:   Left Eye Distance:   Bilateral Distance:    Right Eye Near:   Left Eye Near:    Bilateral Near:     Physical Exam  Constitutional: She appears well-developed.  Musculoskeletal:       Left ankle: She exhibits swelling (mild). She exhibits normal range of motion, no ecchymosis, no deformity, no laceration and normal pulse. Tenderness. Lateral malleolus and medial malleolus tenderness found. No AITFL, no CF ligament, no posterior TFL, no head of 5th metatarsal and no proximal fibula tenderness found. Achilles tendon normal.  Nursing note and vitals reviewed.    UC Treatments / Results  Labs (all labs ordered are listed, but only abnormal results are displayed) Labs Reviewed - No data to display  EKG  EKG Interpretation None       Radiology Dg Ankle Complete Left  Result Date: 07/11/2017 CLINICAL DATA:  Pain, swelling left ankle. EXAM: LEFT ANKLE COMPLETE - 3+ VIEW COMPARISON:  MRI 02/17/2016 FINDINGS: No acute bony abnormality. Specifically, no fracture, subluxation, or dislocation. Soft tissues are intact. Joint spaces are maintained. Plantar and posterior calcaneal spurs noted. IMPRESSION: No acute bony abnormality. Electronically Signed   By: Rolm Baptise M.D.   On: 07/11/2017 10:51    Procedures Procedures (including critical care time)  Medications Ordered in UC Medications - No data to display   Initial Impression / Assessment and Plan / UC Course  I have reviewed the triage vital signs and the nursing notes.  Pertinent labs & imaging results that were available during my care of the patient were reviewed by me and considered in my medical decision making (see chart for details).       Final Clinical Impressions(s) / UC Diagnoses   Final diagnoses:  Acute left ankle pain  Acute idiopathic gout of left ankle    New Prescriptions Discharge Medication List as of 07/11/2017 11:12 AM    START taking these  medications   Details  HYDROcodone-acetaminophen (NORCO/VICODIN) 5-325 MG tablet 1-2 tabs po bid prn, Print    predniSONE (DELTASONE) 20 MG tablet 2 tabs po qd  x 5 days, Normal       1. x-ray results and diagnosis reviewed with patient 2. rx as per orders above; reviewed possible side effects, interactions, risks and benefits  3. Recommend supportive treatment with rest, ice  4. Follow-up prn if symptoms worsen or don't improve  Controlled Substance Prescriptions [place] Controlled Substance Registry consulted? No   Norval Gable, MD 07/11/17 [phone]

## 2017-07-11 NOTE — ED Triage Notes (Signed)
Patient states that she fell on Tuesday.  Patient c/o pain in her left ankle pain on Friday.

## 2017-10-03 ENCOUNTER — Encounter: Payer: Self-pay | Admitting: *Deleted

## 2017-10-03 ENCOUNTER — Ambulatory Visit
Admission: EM | Admit: 2017-10-03 | Discharge: 2017-10-03 | Disposition: A | Discharge status: Home / Self Care | Payer: Medicare Other | Attending: Family Medicine | Admitting: Family Medicine

## 2017-10-03 DIAGNOSIS — J0100 Acute maxillary sinusitis, unspecified: Secondary | ICD-10-CM

## 2017-10-03 DIAGNOSIS — R05 Cough: Secondary | ICD-10-CM

## 2017-10-03 MED ORDER — BENZONATATE 100 MG PO CAPS: 100.0000 mg | ORAL_CAPSULE | Freq: Three times a day (TID) | ORAL | 0 refills | Status: AC | PRN

## 2017-10-03 MED ORDER — AMOXICILLIN 875 MG PO TABS: 875.0000 mg | ORAL_TABLET | Freq: Two times a day (BID) | ORAL | 0 refills | Status: AC

## 2017-10-03 NOTE — ED Triage Notes (Signed)
Chest congestion, productive cough-yellow, chills at night,x1 week.

## 2017-10-03 NOTE — ED Provider Notes (Signed)
[facility]    CSN: [id number] Arrival date & time: 10/03/17  1056     History   Chief Complaint Chief Complaint  Patient presents with  . Cough  . Chills    HPI Meagan Martinez is a 71 y.o. female.   The history is provided by the patient.  Cough  Associated symptoms: wheezing   URI  Presenting symptoms: congestion, cough, facial pain and fatigue   Severity:  Moderate Onset quality:  Sudden Duration:  1 week Timing:  Constant Progression:  Worsening Chronicity:  New Relieved by:  Nothing Ineffective treatments:  OTC medications Associated symptoms: sinus pain and wheezing   Risk factors: chronic respiratory disease     Past Medical History:  Diagnosis Date  . Asthma   . Colon polyps   . Diabetes mellitus without complication ([place])   . Family history of breast cancer   . Family history of prostate cancer   . Family history of stomach cancer   . Hypertension     Patient Active Problem List   Diagnosis Date Noted  . Genetic testing 07/09/2016  . Family history of breast cancer   . Family history of prostate cancer   . Family history of stomach cancer   . Colon polyps     History reviewed. No pertinent surgical history.  OB History    No data available       Home Medications    Prior to Admission medications   Medication Sig Start Date End Date Taking? Authorizing Provider  allopurinol (ZYLOPRIM) 300 MG tablet Take 300 mg daily by mouth.   Yes [provider]  amLODipine (NORVASC) 5 MG tablet Take 5 mg daily by mouth.   Yes [provider]  aspirin 81 MG tablet Take 81 mg by mouth daily.   Yes [provider]  atenolol (TENORMIN) 25 MG tablet Take by mouth daily.   Yes [provider]  colchicine 0.6 MG tablet Take 0.6 mg daily by mouth.   Yes [provider]  fluticasone (FLONASE) 50 MCG/ACT nasal spray Place into both nostrils daily.   Yes [provider]  lisinopril  (PRINIVIL,ZESTRIL) 20 MG tablet Take 20 mg daily by mouth.   Yes [provider]  metFORMIN (GLUCOPHAGE-XR) 500 MG 24 hr tablet Take 500 mg daily with breakfast by mouth.   Yes [provider]  montelukast (SINGULAIR) 10 MG tablet Take 10 mg at bedtime by mouth.   Yes [provider]  omeprazole (PRILOSEC) 20 MG capsule Take 20 mg by mouth daily.   Yes [provider]  timolol (BETIMOL) 0.25 % ophthalmic solution 1-2 drops 2 (two) times daily.   Yes [provider]  Turmeric Curcumin 500 MG CAPS Take by mouth 2 (two) times daily.   Yes [provider]  acetaminophen (TYLENOL) 650 MG CR tablet Take 650 mg by mouth every 8 (eight) hours as needed for pain.    [provider]  amoxicillin (AMOXIL) 875 MG tablet Take 1 tablet (875 mg total) 2 (two) times daily by mouth. 10/03/17   Norval Gable, MD  benzonatate (TESSALON) 100 MG capsule Take 1 capsule (100 mg total) 3 (three) times daily as needed by mouth. 10/03/17   Norval Gable, MD  Biotin 5000 MCG CAPS Take by mouth.    [provider]  HYDROcodone-acetaminophen (NORCO/VICODIN) 5-325 MG tablet 1-2 tabs po bid prn 07/11/17   Norval Gable, MD  metFORMIN (GLUCOPHAGE) 1000 MG tablet Take 500 mg  by mouth 2 (two) times daily with a meal.    [provider]  Omega-3 Fatty Acids (FISH OIL) 1200 MG CPDR Take by mouth.    [provider]  Potassium 99 MG TABS Take by mouth.    [provider]  predniSONE (DELTASONE) 20 MG tablet 2 tabs po qd x 5 days 07/11/17   Norval Gable, MD  triamterene-hydrochlorothiazide (MAXZIDE) 75-50 MG per tablet Take 1 tablet by mouth daily.    [provider]    Family History Family History  Problem Relation Age of Onset  . Alzheimer's disease Mother   . Lung cancer Father        smoker  . Prostate cancer Brother   . Stomach cancer Paternal Aunt   . Prostate cancer Brother   . Stomach cancer Cousin         paternal first cousin    Social History Social History   Tobacco Use  . Smoking status: Never Smoker  . Smokeless tobacco: Never Used  Substance Use Topics  . Alcohol use: No  . Drug use: No     Allergies   Patient has no known allergies.   Review of Systems Review of Systems  Constitutional: Positive for fatigue.  HENT: Positive for congestion and sinus pain.   Respiratory: Positive for cough and wheezing.      Physical Exam Triage Vital Signs ED Triage Vitals  Enc Vitals Group     BP 10/03/17 1150 (!) 158/77     Pulse Rate 10/03/17 1150 70     Resp 10/03/17 1150 16     Temp 10/03/17 1150 97.7 F (36.5 C)     Temp Source 10/03/17 1150 Oral     SpO2 10/03/17 1150 100 %     Weight --      Height --      Head Circumference --      Peak Flow --      Pain Score 10/03/17 1219 0     Pain Loc --      Pain Edu? --      Excl. in [place]? --    No data found.  Updated Vital Signs BP (!) 158/77 (BP Location: Left Arm)   Pulse 70   Temp 97.7 F (36.5 C) (Oral)   Resp 16   SpO2 100%   Visual Acuity Right Eye Distance:   Left Eye Distance:   Bilateral Distance:    Right Eye Near:   Left Eye Near:    Bilateral Near:     Physical Exam  Constitutional: She appears well-developed and well-nourished. No distress.  HENT:  Head: Normocephalic and atraumatic.  Right Ear: Tympanic membrane, external ear and ear canal normal.  Left Ear: Tympanic membrane, external ear and ear canal normal.  Nose: Mucosal edema and rhinorrhea present. No nose lacerations, sinus tenderness, nasal deformity, septal deviation or nasal septal hematoma. No epistaxis.  No foreign bodies. Right sinus exhibits maxillary sinus tenderness and frontal sinus tenderness. Left sinus exhibits maxillary sinus tenderness and frontal sinus tenderness.  Mouth/Throat: Uvula is midline, oropharynx is clear and moist and mucous membranes are normal. No oropharyngeal exudate.  Eyes: Conjunctivae and EOM are  normal. Pupils are equal, round, and reactive to light. Right eye exhibits no discharge. Left eye exhibits no discharge. No scleral icterus.  Neck: Normal range of motion. Neck supple. No thyromegaly present.  Cardiovascular: Normal rate, regular rhythm and normal heart sounds.  Pulmonary/Chest: Effort normal and breath sounds normal.  No respiratory distress. She has no wheezes. She has no rales.  Lymphadenopathy:    She has no cervical adenopathy.  Skin: She is not diaphoretic.  Nursing note and vitals reviewed.    UC Treatments / Results  Labs (all labs ordered are listed, but only abnormal results are displayed) Labs Reviewed - No data to display  EKG  EKG Interpretation None       Radiology No results found.  Procedures Procedures (including critical care time)  Medications Ordered in UC Medications - No data to display   Initial Impression / Assessment and Plan / UC Course  I have reviewed the triage vital signs and the nursing notes.  Pertinent labs & imaging results that were available during my care of the patient were reviewed by me and considered in my medical decision making (see chart for details).       Final Clinical Impressions(s) / UC Diagnoses   Final diagnoses:  Acute maxillary sinusitis, recurrence not specified    New Prescriptions This SmartLink is deprecated. Use AVSMEDLIST instead to display the medication list for a patient.   1. diagnosis reviewed with patient 2. rx as per orders above; reviewed possible side effects, interactions, risks and benefits  3. Recommend supportive treatment with  4. Follow-up prn if symptoms worsen or don't improve Controlled Substance Prescriptions [place] Controlled Substance Registry consulted? Not Applicable   Norval Gable, MD 10/03/17 [phone]

## 2017-11-20 IMAGING — CR DG ANKLE COMPLETE 3+V*L*
3 series · 3 of 3 positions shown · non-contrast
Comparison: MRI 02/17/2016

CLINICAL DATA: Pain, swelling left ankle.

EXAM:
LEFT ANKLE COMPLETE - 3+ VIEW

[ankle ap]
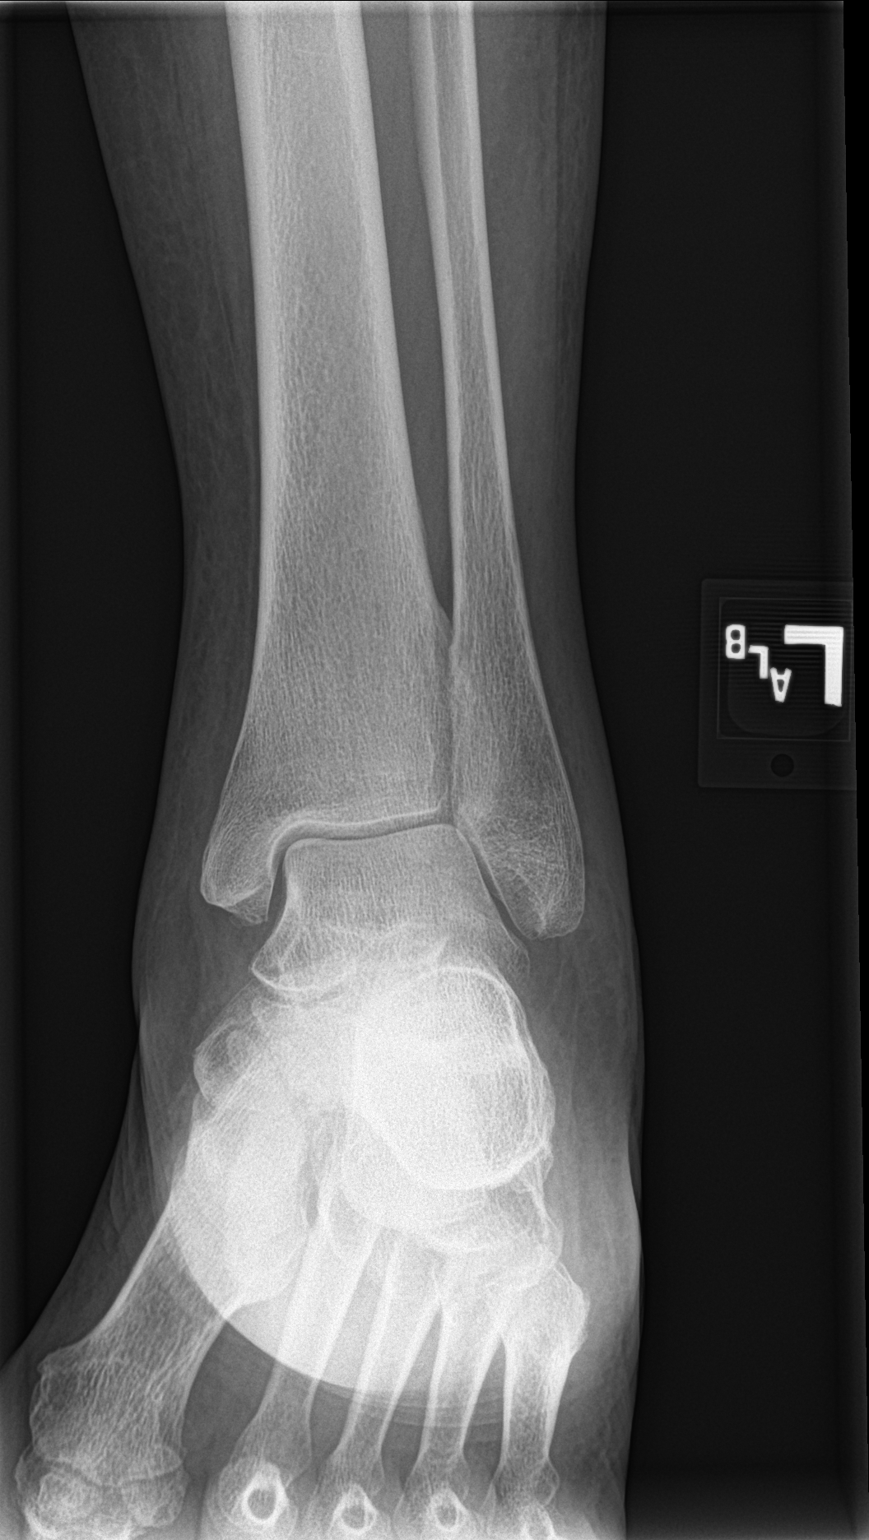

[ankle obl]
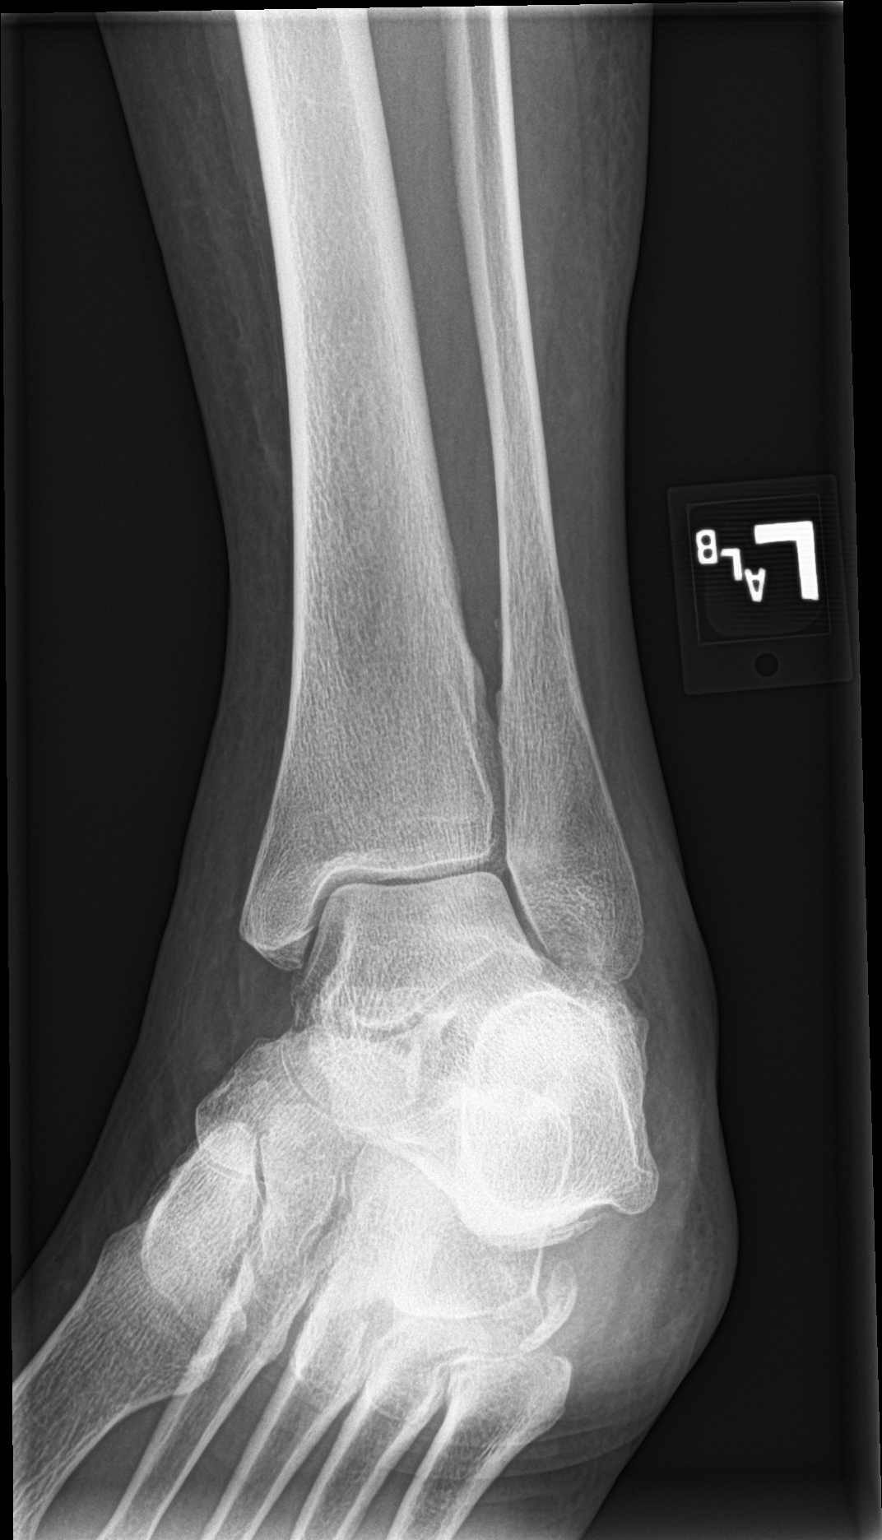

[ankle lat]
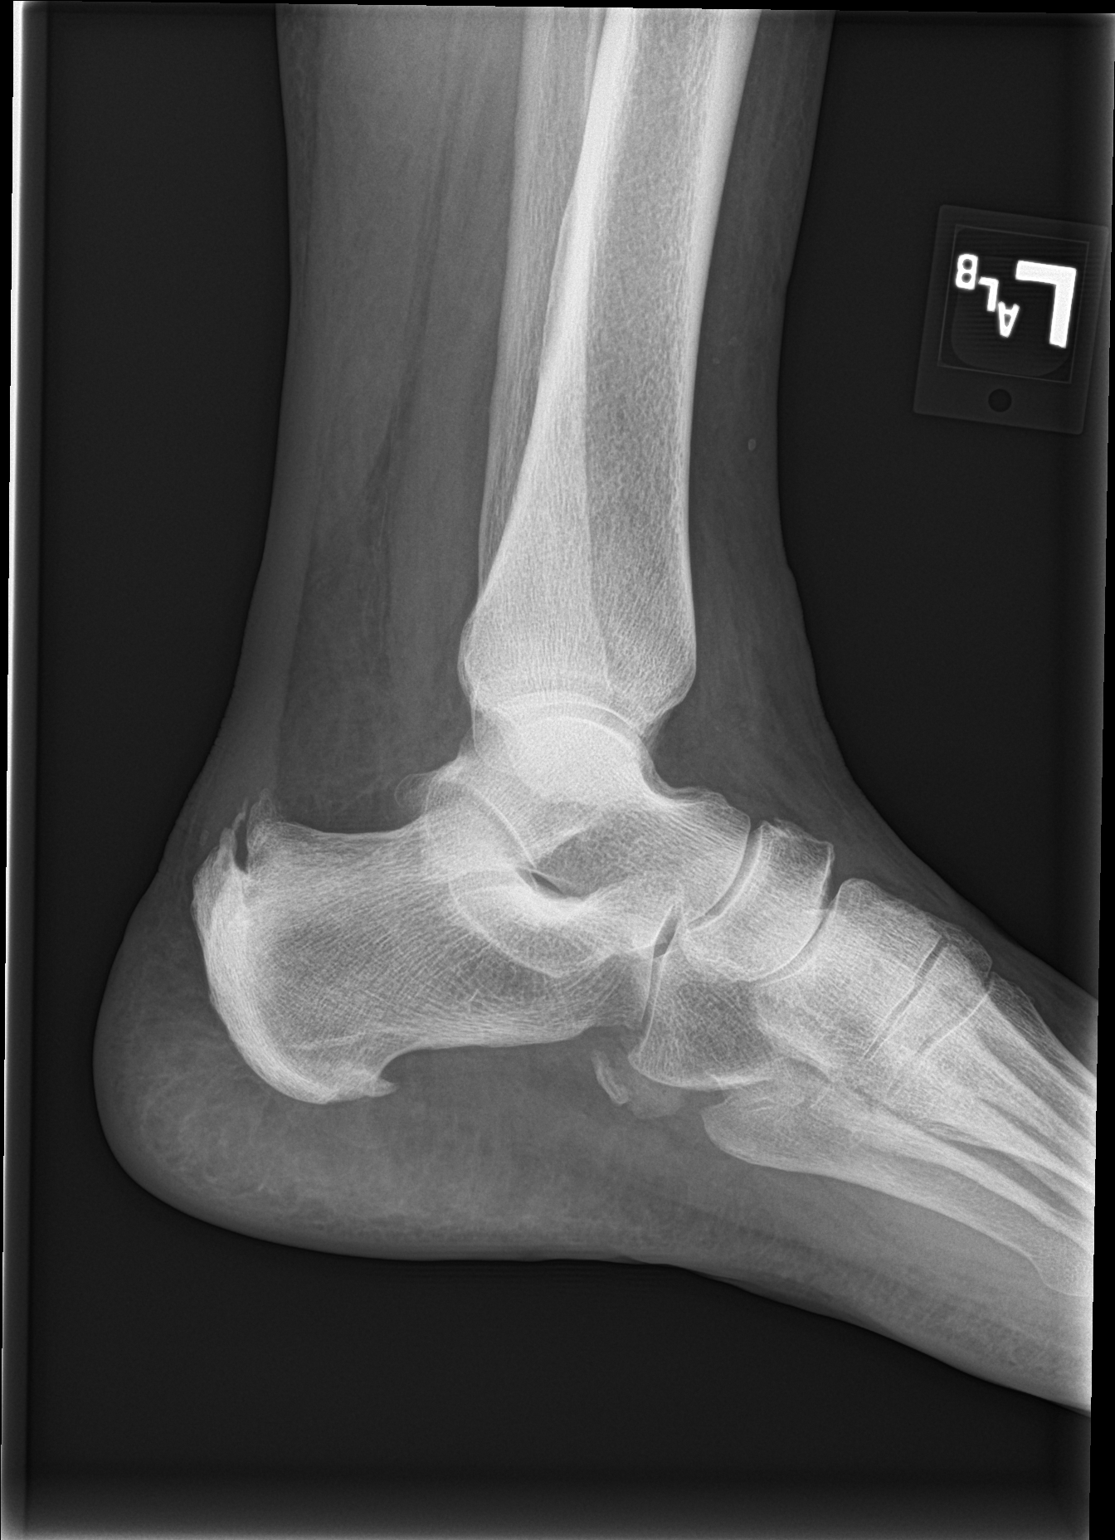

[3 of 3 positions shown; findings below may reference images not displayed]

FINDINGS: No acute bony abnormality. Specifically, no fracture, subluxation,
or dislocation. Soft tissues are intact. Joint spaces are
maintained. Plantar and posterior calcaneal spurs noted.
IMPRESSION: No acute bony abnormality.

## 2017-12-05 DIAGNOSIS — M1A29X0 Drug-induced chronic gout, multiple sites, without tophus (tophi): Secondary | ICD-10-CM | POA: Insufficient documentation

## 2019-05-03 DIAGNOSIS — Z86010 Personal history of colonic polyps: Secondary | ICD-10-CM | POA: Insufficient documentation

## 2019-05-07 DIAGNOSIS — K5790 Diverticulosis of intestine, part unspecified, without perforation or abscess without bleeding: Secondary | ICD-10-CM | POA: Insufficient documentation

## 2019-10-26 DIAGNOSIS — R001 Bradycardia, unspecified: Secondary | ICD-10-CM | POA: Insufficient documentation

## 2019-10-26 DIAGNOSIS — I495 Sick sinus syndrome: Secondary | ICD-10-CM | POA: Insufficient documentation

## 2019-11-15 DIAGNOSIS — Z950 Presence of cardiac pacemaker: Secondary | ICD-10-CM | POA: Insufficient documentation

## 2021-08-06 DIAGNOSIS — R600 Localized edema: Secondary | ICD-10-CM | POA: Insufficient documentation

## 2021-08-23 DIAGNOSIS — I10 Essential (primary) hypertension: Secondary | ICD-10-CM | POA: Insufficient documentation

## 2021-08-23 DIAGNOSIS — E785 Hyperlipidemia, unspecified: Secondary | ICD-10-CM | POA: Insufficient documentation

## 2021-08-23 DIAGNOSIS — E119 Type 2 diabetes mellitus without complications: Secondary | ICD-10-CM | POA: Insufficient documentation

## 2021-08-23 DIAGNOSIS — R600 Localized edema: Secondary | ICD-10-CM | POA: Insufficient documentation

## 2021-08-23 DIAGNOSIS — M79669 Pain in unspecified lower leg: Secondary | ICD-10-CM | POA: Insufficient documentation

## 2021-08-23 NOTE — Progress Notes (Signed)
MRN : [id number]  Meagan Martinez is a 75 y.o. (1946-01-10) female who presents with chief complaint of check on leg swelling.  History of Present Illness:   Patient is seen for evaluation of leg swelling. The patient first noticed the swelling remotely but is now concerned because of a significant increase in the overall edema. The swelling is associated with pain and discoloration. The patient notes that in the morning the legs are significantly improved but they steadily worsened throughout the course of the day. Elevation makes the legs better, dependency makes them much worse.   There is no history of ulcerations associated with the swelling.   The patient denies any recent changes in their medications.  The patient has not been wearing graduated compression.  The patient has no had any past angiography, interventions or vascular surgery.  The patient denies a history of DVT or PE. There is no prior history of phlebitis. There is no history of primary lymphedema.  There is no history of radiation treatment to the groin or pelvis No history of malignancies. No history of trauma or groin or pelvic surgery. No history of foreign travel or parasitic infections area    No outpatient medications have been marked as taking for the 08/24/21 encounter (Appointment) with Delana Meyer, Dolores Lory, MD.    Past Medical History:  Diagnosis Date   Asthma    Colon polyps    Diabetes mellitus without complication ([place])    Family history of breast cancer    Family history of prostate cancer    Family history of stomach cancer    Hypertension     No past surgical history on file.  Social History Social History   Tobacco Use   Smoking status: Never   Smokeless tobacco: Never  Vaping Use   Vaping Use: Never used  Substance Use Topics   Alcohol use: No   Drug use: No    Family History Family History  Problem Relation Age of Onset   Alzheimer's disease Mother    Lung cancer Father         smoker   Prostate cancer Brother    Stomach cancer Paternal Aunt    Prostate cancer Brother    Stomach cancer Cousin        paternal first cousin    No Known Allergies   REVIEW OF SYSTEMS (Negative unless checked)  Constitutional: [] Weight loss  [] Fever  [] Chills Cardiac: [] Chest pain   [] Chest pressure   [] Palpitations   [] Shortness of breath when laying flat   [] Shortness of breath with exertion. Vascular:  [] Pain in legs with walking   [] Pain in legs at rest  [] History of DVT   [] Phlebitis   [x] Swelling in legs   [] Varicose veins   [] Non-healing ulcers Pulmonary:   [] Uses home oxygen   [] Productive cough   [] Hemoptysis   [] Wheeze  [] COPD   [] Asthma Neurologic:  [] Dizziness   [] Seizures   [] History of stroke   [] History of TIA  [] Aphasia   [] Vissual changes   [] Weakness or numbness in arm   [] Weakness or numbness in leg Musculoskeletal:   [] Joint swelling   [] Joint pain   [] Low back pain Hematologic:  [] Easy bruising  [] Easy bleeding   [] Hypercoagulable state   [] Anemic Gastrointestinal:  [] Diarrhea   [] Vomiting  [] Gastroesophageal reflux/heartburn   [] Difficulty swallowing. Genitourinary:  [] Chronic kidney disease   [] Difficult urination  [] Frequent urination   [] Blood in urine Skin:  [] Rashes   [] Ulcers  Psychological:  [] History of anxiety   []  History of major depression.  Physical Examination  There were no vitals filed for this visit. There is no height or weight on file to calculate BMI. Gen: WD/WN, NAD Head: [place]/AT, No temporalis wasting.  Ear/Nose/Throat: Hearing grossly intact, nares w/o erythema or drainage, pinna without lesions Eyes: PER, EOMI, sclera nonicteric.  Neck: Supple, no gross masses.  No JVD.  Pulmonary:  Good air movement, no audible wheezing, no use of accessory muscles.  Cardiac: RRR, precordium not hyperdynamic. Vascular:  scattered varicosities present bilaterally. Moderate to severe venous stasis changes to the legs bilaterally.  3+ soft  pitting edema  Vessel Right Left  Radial Palpable Palpable  Gastrointestinal: soft, non-distended. No guarding/no peritoneal signs.  Musculoskeletal: M/S 5/5 throughout.  No deformity.  Neurologic: CN 2-12 intact. Pain and light touch intact in extremities.  Symmetrical.  Speech is fluent. Motor exam as listed above. Psychiatric: Judgment intact, Mood & affect appropriate for pt's clinical situation. Dermatologic: Venous rashes no ulcers noted.  No changes consistent with cellulitis. Lymph : No lichenification or skin changes of chronic lymphedema.  CBC No results found for: WBC, HGB, HCT, MCV, PLT  BMET No results found for: NA, K, CL, CO2, GLUCOSE, BUN, CREATININE, CALCIUM, GFRNONAA, GFRAA CrCl cannot be calculated (No successful lab value found.).  COAG No results found for: INR, PROTIME  Radiology No results found.   Assessment/Plan 1. Pain and swelling of lower leg, unspecified laterality I have had a long discussion with the patient regarding swelling and why it  causes symptoms.  Patient will begin wearing graduated compression stockings class 1 (20-30 mmHg) on a daily basis a prescription was given. The patient will  beginning wearing the stockings first thing in the morning and removing them in the evening. The patient is instructed specifically not to sleep in the stockings.   In addition, behavioral modification will be initiated.  This will include frequent elevation, use of over the counter pain medications and exercise such as walking.  I have reviewed systemic causes for chronic edema such as liver, kidney and cardiac etiologies.  The patient denies problems with these organ systems.    Consideration for a lymph pump will also be made based upon the effectiveness of conservative therapy.  This would help to improve the edema control and prevent sequela such as ulcers and infections   Patient should undergo duplex ultrasound of the venous system to ensure that DVT or  reflux is not present.  The patient will follow-up with me after the ultrasound.   - VAS [place] ABI WITH/WO TBI; Future - VAS [place] LOWER EXTREMITY VENOUS REFLUX; Future  2. Chronic venous insufficiency I have had a long discussion with the patient regarding swelling and why it  causes symptoms.  Patient will begin wearing graduated compression stockings class 1 (20-30 mmHg) on a daily basis a prescription was given. The patient will  beginning wearing the stockings first thing in the morning and removing them in the evening. The patient is instructed specifically not to sleep in the stockings.   In addition, behavioral modification will be initiated.  This will include frequent elevation, use of over the counter pain medications and exercise such as walking.  I have reviewed systemic causes for chronic edema such as liver, kidney and cardiac etiologies.  The patient denies problems with these organ systems.    Consideration for a lymph pump will also be made based upon the effectiveness of conservative therapy.  This would help to improve the edema control and prevent sequela such as ulcers and infections   Patient should undergo duplex ultrasound of the venous system to ensure that DVT or reflux is not present.  The patient will follow-up with me after the ultrasound.   - VAS [place] LOWER EXTREMITY VENOUS REFLUX; Future  3. Essential hypertension Continue antihypertensive medications as already ordered, these medications have been reviewed and there are no changes at this time.   4. Mixed hyperlipidemia Continue statin as ordered and reviewed, no changes at this time   5. Type 2 diabetes mellitus without complication, without long-term current use of insulin (HCC) Continue hypoglycemic medications as already ordered, these medications have been reviewed and there are no changes at this time.  Hgb A1C to be monitored as already arranged by primary service      Hortencia Pilar,  MD  08/23/2021 3:01 PM

## 2021-08-24 ENCOUNTER — Ambulatory Visit (INDEPENDENT_AMBULATORY_CARE_PROVIDER_SITE_OTHER): Payer: Medicare PPO | Admitting: Vascular Surgery

## 2021-08-24 ENCOUNTER — Encounter (INDEPENDENT_AMBULATORY_CARE_PROVIDER_SITE_OTHER): Payer: Self-pay | Admitting: Vascular Surgery

## 2021-08-24 ENCOUNTER — Other Ambulatory Visit: Payer: Self-pay

## 2021-08-24 VITALS — BP 134/71 | HR 65 | Ht 62.0 in | Wt 220.0 lb

## 2021-08-24 DIAGNOSIS — I10 Essential (primary) hypertension: Secondary | ICD-10-CM

## 2021-08-24 DIAGNOSIS — E782 Mixed hyperlipidemia: Secondary | ICD-10-CM | POA: Diagnosis not present

## 2021-08-24 DIAGNOSIS — R600 Localized edema: Secondary | ICD-10-CM

## 2021-08-24 DIAGNOSIS — E663 Overweight: Secondary | ICD-10-CM | POA: Insufficient documentation

## 2021-08-24 DIAGNOSIS — M79669 Pain in unspecified lower leg: Secondary | ICD-10-CM | POA: Diagnosis not present

## 2021-08-24 DIAGNOSIS — J683 Other acute and subacute respiratory conditions due to chemicals, gases, fumes and vapors: Secondary | ICD-10-CM | POA: Insufficient documentation

## 2021-08-24 DIAGNOSIS — E119 Type 2 diabetes mellitus without complications: Secondary | ICD-10-CM

## 2021-08-24 DIAGNOSIS — D259 Leiomyoma of uterus, unspecified: Secondary | ICD-10-CM | POA: Insufficient documentation

## 2021-08-24 DIAGNOSIS — I872 Venous insufficiency (chronic) (peripheral): Secondary | ICD-10-CM

## 2021-08-24 DIAGNOSIS — M7989 Other specified soft tissue disorders: Secondary | ICD-10-CM

## 2021-09-21 ENCOUNTER — Encounter (INDEPENDENT_AMBULATORY_CARE_PROVIDER_SITE_OTHER): Payer: Self-pay | Admitting: Vascular Surgery

## 2021-09-21 ENCOUNTER — Ambulatory Visit (INDEPENDENT_AMBULATORY_CARE_PROVIDER_SITE_OTHER): Payer: Medicare PPO

## 2021-09-21 ENCOUNTER — Ambulatory Visit (INDEPENDENT_AMBULATORY_CARE_PROVIDER_SITE_OTHER): Payer: Medicare PPO | Admitting: Vascular Surgery

## 2021-09-21 ENCOUNTER — Other Ambulatory Visit: Payer: Self-pay

## 2021-09-21 VITALS — BP 145/76 | HR 69 | Resp 16 | Wt 223.0 lb

## 2021-09-21 DIAGNOSIS — M7989 Other specified soft tissue disorders: Secondary | ICD-10-CM

## 2021-09-21 DIAGNOSIS — I872 Venous insufficiency (chronic) (peripheral): Secondary | ICD-10-CM | POA: Diagnosis not present

## 2021-09-21 DIAGNOSIS — M79669 Pain in unspecified lower leg: Secondary | ICD-10-CM

## 2021-09-21 DIAGNOSIS — I890 Lymphedema, not elsewhere classified: Secondary | ICD-10-CM | POA: Diagnosis not present

## 2021-09-21 DIAGNOSIS — E119 Type 2 diabetes mellitus without complications: Secondary | ICD-10-CM

## 2021-09-21 DIAGNOSIS — I10 Essential (primary) hypertension: Secondary | ICD-10-CM

## 2021-09-21 NOTE — Progress Notes (Signed)
MRN : [id number]  Meagan Martinez is a 75 y.o. ([birth year]) female who presents with chief complaint of check leg swelling.  History of Present Illness:   The patient returns to the office for followup evaluation regarding leg swelling.  The swelling has persisted and the pain associated with swelling continues. There have not been any interval development of a ulcerations or wounds.  Since the previous visit the patient has been wearing graduated compression stockings and has noted little if any improvement in the lymphedema. The patient has been using compression routinely morning until night.  The patient also states elevation during the day and exercise is being done too.   Current Meds  Medication Sig   albuterol (VENTOLIN HFA) 108 (90 Base) MCG/ACT inhaler Inhale into the lungs.   allopurinol (ZYLOPRIM) 300 MG tablet Take 300 mg daily by mouth.   amLODipine (NORVASC) 5 MG tablet Take 5 mg daily by mouth.   atorvastatin (LIPITOR) 40 MG tablet Take by mouth.   Biotin 5000 MCG CAPS Take by mouth.   CALCIUM CITRATE-VITAMIN D3 PO Take 1 tablet by mouth daily.   cyanocobalamin 1000 MCG tablet Take by mouth.   fluticasone (FLONASE) 50 MCG/ACT nasal spray Place into both nostrils daily.   furosemide (LASIX) 20 MG tablet Take by mouth.   Garlic 10 MG CAPS Take by mouth.   lisinopril (PRINIVIL,ZESTRIL) 20 MG tablet Take 20 mg daily by mouth.   montelukast (SINGULAIR) 10 MG tablet Take 10 mg at bedtime by mouth.   Multiple Vitamin (MULTI-VITAMIN) tablet Take 1 tablet by mouth daily.   Omega-3 Fatty Acids (FISH OIL) 1200 MG CPDR Take by mouth.   omeprazole (PRILOSEC) 20 MG capsule Take 20 mg by mouth daily.   timolol (BETIMOL) 0.25 % ophthalmic solution 1-2 drops 2 (two) times daily.   triamcinolone cream (KENALOG) 0.5 % Apply topically.   Turmeric Curcumin 500 MG CAPS Take by mouth 2 (two) times daily.    Past Medical History:  Diagnosis Date   Asthma    Colon polyps    Diabetes  mellitus without complication ([place])    Family history of breast cancer    Family history of prostate cancer    Family history of stomach cancer    Hypertension     No past surgical history on file.  Social History Social History   Tobacco Use   Smoking status: Never   Smokeless tobacco: Never  Vaping Use   Vaping Use: Never used  Substance Use Topics   Alcohol use: No   Drug use: No    Family History Family History  Problem Relation Age of Onset   Alzheimer's disease Mother    Lung cancer Father        smoker   Prostate cancer Brother    Stomach cancer Paternal Aunt    Prostate cancer Brother    Stomach cancer Cousin        paternal first cousin    Allergies  Allergen Reactions   Wound Dressing Adhesive Rash   Tramadol Other (See Comments)    "zombie"     REVIEW OF SYSTEMS (Negative unless checked)  Constitutional: [] Weight loss  [] Fever  [] Chills Cardiac: [] Chest pain   [] Chest pressure   [] Palpitations   [] Shortness of breath when laying flat   [] Shortness of breath with exertion. Vascular:  [] Pain in legs with walking   [x] Pain in legs at rest  [] History of DVT   [] Phlebitis   [x] Swelling in legs   []   Varicose veins   [] Non-healing ulcers Pulmonary:   [] Uses home oxygen   [] Productive cough   [] Hemoptysis   [] Wheeze  [] COPD   [] Asthma Neurologic:  [] Dizziness   [] Seizures   [] History of stroke   [] History of TIA  [] Aphasia   [] Vissual changes   [] Weakness or numbness in arm   [] Weakness or numbness in leg Musculoskeletal:   [] Joint swelling   [] Joint pain   [] Low back pain Hematologic:  [] Easy bruising  [] Easy bleeding   [] Hypercoagulable state   [] Anemic Gastrointestinal:  [] Diarrhea   [] Vomiting  [] Gastroesophageal reflux/heartburn   [] Difficulty swallowing. Genitourinary:  [] Chronic kidney disease   [] Difficult urination  [] Frequent urination   [] Blood in urine Skin:  [] Rashes   [] Ulcers  Psychological:  [] History of anxiety   []  History of major  depression.  Physical Examination  There were no vitals filed for this visit. There is no height or weight on file to calculate BMI. Gen: WD/WN, NAD Head: [place]/AT, No temporalis wasting.  Ear/Nose/Throat: Hearing grossly intact, nares w/o erythema or drainage, pinna without lesions Eyes: PER, EOMI, sclera nonicteric.  Neck: Supple, no gross masses.  No JVD.  Pulmonary:  Good air movement, no audible wheezing, no use of accessory muscles.  Cardiac: RRR, precordium not hyperdynamic. Vascular:  scattered varicosities present bilaterally.  Mild venous stasis changes to the legs bilaterally.  3+ soft pitting edema  Vessel Right Left  Radial Palpable Palpable  Gastrointestinal: soft, non-distended. No guarding/no peritoneal signs.  Musculoskeletal: M/S 5/5 throughout.  No deformity.  Neurologic: CN 2-12 intact. Pain and light touch intact in extremities.  Symmetrical.  Speech is fluent. Motor exam as listed above. Psychiatric: Judgment intact, Mood & affect appropriate for pt's clinical situation. Dermatologic: Venous rashes no ulcers noted.  No changes consistent with cellulitis. Lymph : No lichenification or skin changes of chronic lymphedema.  CBC No results found for: WBC, HGB, HCT, MCV, PLT  BMET No results found for: NA, K, CL, CO2, GLUCOSE, BUN, CREATININE, CALCIUM, GFRNONAA, GFRAA CrCl cannot be calculated (No successful lab value found.).  COAG No results found for: INR, PROTIME  Radiology No results found.   Assessment/Plan 1. Pain and swelling of lower leg, unspecified laterality Recommend:  No surgery or intervention at this point in time.    I have reviewed my previous discussion with the patient regarding swelling and why it causes symptoms.  Patient will continue wearing graduated compression stockings class 1 (20-30 mmHg) on a daily basis. The patient will  beginning wearing the stockings first thing in the morning and removing them in the evening. The patient is  instructed specifically not to sleep in the stockings.    In addition, behavioral modification including several periods of elevation of the lower extremities during the day will be continued.  This was reviewed with the patient during the initial visit.  The patient will also continue routine exercise, especially walking on a daily basis as was discussed during the initial visit.    Despite conservative treatments including graduated compression therapy class 1 and behavioral modification including exercise and elevation the patient  has not obtained adequate control of the lymphedema.  The patient still has stage 3 lymphedema and therefore, I believe that a lymph pump should be added to improve the control of the patient's lymphedema.  Additionally, a lymph pump is warranted because it will reduce the risk of cellulitis and ulceration in the future.  Patient should follow-up in six months    2. Lymphedema  Recommend:  No surgery or intervention at this point in time.    I have reviewed my previous discussion with the patient regarding swelling and why it causes symptoms.  Patient will continue wearing graduated compression stockings class 1 (20-30 mmHg) on a daily basis. The patient will  beginning wearing the stockings first thing in the morning and removing them in the evening. The patient is instructed specifically not to sleep in the stockings.    In addition, behavioral modification including several periods of elevation of the lower extremities during the day will be continued.  This was reviewed with the patient during the initial visit.  The patient will also continue routine exercise, especially walking on a daily basis as was discussed during the initial visit.    Despite conservative treatments including graduated compression therapy class 1 and behavioral modification including exercise and elevation the patient  has not obtained adequate control of the lymphedema.  The patient  still has stage 3 lymphedema and therefore, I believe that a lymph pump should be added to improve the control of the patient's lymphedema.  Additionally, a lymph pump is warranted because it will reduce the risk of cellulitis and ulceration in the future.  Patient should follow-up in six months    3. Chronic venous insufficiency Recommend:  No surgery or intervention at this point in time.    I have reviewed my previous discussion with the patient regarding swelling and why it causes symptoms.  Patient will continue wearing graduated compression stockings class 1 (20-30 mmHg) on a daily basis. The patient will  beginning wearing the stockings first thing in the morning and removing them in the evening. The patient is instructed specifically not to sleep in the stockings.    In addition, behavioral modification including several periods of elevation of the lower extremities during the day will be continued.  This was reviewed with the patient during the initial visit.  The patient will also continue routine exercise, especially walking on a daily basis as was discussed during the initial visit.    Despite conservative treatments including graduated compression therapy class 1 and behavioral modification including exercise and elevation the patient  has not obtained adequate control of the lymphedema.  The patient still has stage 3 lymphedema and therefore, I believe that a lymph pump should be added to improve the control of the patient's lymphedema.  Additionally, a lymph pump is warranted because it will reduce the risk of cellulitis and ulceration in the future.  Patient should follow-up in six months    4. Essential hypertension Continue antihypertensive medications as already ordered, these medications have been reviewed and there are no changes at this time.   5. Type 2 diabetes mellitus without complication, without long-term current use of insulin ([facility]CC) Continue hypoglycemic  medications as already ordered, these medications have been reviewed and there are no changes at this time.  Hgb A1C to be monitored as already arranged by primary service     Hortencia Pilar, MD  09/21/2021 2:41 PM

## 2021-09-27 ENCOUNTER — Encounter (INDEPENDENT_AMBULATORY_CARE_PROVIDER_SITE_OTHER): Payer: Self-pay | Admitting: Vascular Surgery

## 2021-09-27 DIAGNOSIS — I890 Lymphedema, not elsewhere classified: Secondary | ICD-10-CM | POA: Insufficient documentation

## 2021-12-23 ENCOUNTER — Encounter (INDEPENDENT_AMBULATORY_CARE_PROVIDER_SITE_OTHER): Payer: Self-pay | Admitting: Vascular Surgery

## 2021-12-24 ENCOUNTER — Encounter (INDEPENDENT_AMBULATORY_CARE_PROVIDER_SITE_OTHER): Payer: Self-pay | Admitting: Vascular Surgery

## 2022-03-09 ENCOUNTER — Encounter (INDEPENDENT_AMBULATORY_CARE_PROVIDER_SITE_OTHER): Payer: Self-pay | Admitting: Vascular Surgery

## 2022-03-22 ENCOUNTER — Ambulatory Visit (INDEPENDENT_AMBULATORY_CARE_PROVIDER_SITE_OTHER): Payer: Medicare PPO | Admitting: Vascular Surgery

## 2022-03-22 ENCOUNTER — Encounter (INDEPENDENT_AMBULATORY_CARE_PROVIDER_SITE_OTHER): Payer: Self-pay | Admitting: Vascular Surgery

## 2022-03-22 VITALS — BP 146/68 | HR 71 | Resp 16 | Wt 221.8 lb

## 2022-03-22 DIAGNOSIS — I890 Lymphedema, not elsewhere classified: Secondary | ICD-10-CM

## 2022-03-22 DIAGNOSIS — E782 Mixed hyperlipidemia: Secondary | ICD-10-CM

## 2022-03-22 DIAGNOSIS — I872 Venous insufficiency (chronic) (peripheral): Secondary | ICD-10-CM

## 2022-03-22 DIAGNOSIS — E119 Type 2 diabetes mellitus without complications: Secondary | ICD-10-CM

## 2022-03-22 DIAGNOSIS — I10 Essential (primary) hypertension: Secondary | ICD-10-CM

## 2022-03-22 NOTE — Progress Notes (Signed)
? ? ? ? ?MRN : [id number] ? ?Meagan Martinez is a 76 y.o. ([birth year]) female who presents with chief complaint of legs swell. ? ?History of Present Illness:  ? ?The patient returns to the office for followup evaluation regarding leg swelling.  The swelling has persisted but with the lymph pump is under much, much better controlled. The pain associated with swelling is decreased. There have not been any interval development of a ulcerations or wounds. ? ?The patient denies problems with the pump, noting it is working well and the leggings are in good condition. ? ?Since the previous visit the patient has been wearing graduated compression stockings and using the lymph pump on a routine basis and  has noted significant improvement in the lymphedema.  ? ?Patient stated the lymph pump has been helpful with the treatment of the lymphedema.   ? ?No outpatient medications have been marked as taking for the 03/22/22 encounter (Appointment) with Delana Meyer, Dolores Lory, MD.  ? ? ?Past Medical History:  ?Diagnosis Date  ? Asthma   ? Colon polyps   ? Diabetes mellitus without complication ([place])   ? Family history of breast cancer   ? Family history of prostate cancer   ? Family history of stomach cancer   ? Hypertension   ? ? ?No past surgical history on file. ? ?Social History ?Social History  ? ?Tobacco Use  ? Smoking status: Never  ? Smokeless tobacco: Never  ?Vaping Use  ? Vaping Use: Never used  ?Substance Use Topics  ? Alcohol use: No  ? Drug use: No  ? ? ?Family History ?Family History  ?Problem Relation Age of Onset  ? Alzheimer's disease Mother   ? Lung cancer Father   ?     smoker  ? Prostate cancer Brother   ? Stomach cancer Paternal Aunt   ? Prostate cancer Brother   ? Stomach cancer Cousin   ?     paternal first cousin  ? ? ?Allergies  ?Allergen Reactions  ? Wound Dressing Adhesive Rash  ? Tramadol Other (See Comments)  ?  "zombie"  ? ? ? ?REVIEW OF SYSTEMS (Negative unless checked) ? ?Constitutional: '[]'$ Weight loss   '[]'$ Fever  '[]'$ Chills ?Cardiac: '[]'$ Chest pain   '[]'$ Chest pressure   '[]'$ Palpitations   '[]'$ Shortness of breath when laying flat   '[]'$ Shortness of breath with exertion. ?Vascular:  '[]'$ Pain in legs with walking   '[x]'$ Pain in legs with standing  '[]'$ History of DVT   '[]'$ Phlebitis   '[x]'$ Swelling in legs   '[]'$ Varicose veins   '[]'$ Non-healing ulcers ?Pulmonary:   '[]'$ Uses home oxygen   '[]'$ Productive cough   '[]'$ Hemoptysis   '[]'$ Wheeze  '[]'$ COPD   '[]'$ Asthma ?Neurologic:  '[]'$ Dizziness   '[]'$ Seizures   '[]'$ History of stroke   '[]'$ History of TIA  '[]'$ Aphasia   '[]'$ Vissual changes   '[]'$ Weakness or numbness in arm   '[]'$ Weakness or numbness in leg ?Musculoskeletal:   '[]'$ Joint swelling   '[]'$ Joint pain   '[]'$ Low back pain ?Hematologic:  '[]'$ Easy bruising  '[]'$ Easy bleeding   '[]'$ Hypercoagulable state   '[]'$ Anemic ?Gastrointestinal:  '[]'$ Diarrhea   '[]'$ Vomiting  '[]'$ Gastroesophageal reflux/heartburn   '[]'$ Difficulty swallowing. ?Genitourinary:  '[]'$ Chronic kidney disease   '[]'$ Difficult urination  '[]'$ Frequent urination   '[]'$ Blood in urine ?Skin:  '[]'$ Rashes   '[]'$ Ulcers  ?Psychological:  '[]'$ History of anxiety   '[]'$  History of major depression. ? ?Physical Examination ? ?There were no vitals filed for this visit. ?There is no height or weight on file to calculate BMI. ?Gen: WD/WN, NAD ?Head:  [place]/AT, No temporalis wasting.  ?Ear/Nose/Throat: Hearing grossly intact, nares w/o erythema or drainage, pinna without lesions ?Eyes: PER, EOMI, sclera nonicteric.  ?Neck: Supple, no gross masses.  No JVD.  ?Pulmonary:  Good air movement, no audible wheezing, no use of accessory muscles.  ?Cardiac: RRR, precordium not hyperdynamic. ?Vascular:  scattered varicosities present bilaterally.  Moderate venous stasis changes to the legs bilaterally.  1-2+ soft pitting edema much better ?Vessel Right Left  ?Radial Palpable Palpable  ?Gastrointestinal: soft, non-distended. No guarding/no peritoneal signs.  ?Musculoskeletal: M/S 5/5 throughout.  No deformity.  ?Neurologic: CN 2-12 intact. Pain and light touch intact in  extremities.  Symmetrical.  Speech is fluent. Motor exam as listed above. ?Psychiatric: Judgment intact, Mood & affect appropriate for pt's clinical situation. ?Dermatologic: Venous rashes no ulcers noted.  No changes consistent with cellulitis. ?Lymph : No lichenification or skin changes of chronic lymphedema. ? ?CBC ?No results found for: WBC, HGB, HCT, MCV, PLT ? ?BMET ?No results found for: NA, K, CL, CO2, GLUCOSE, BUN, CREATININE, CALCIUM, GFRNONAA, GFRAA ?CrCl cannot be calculated (No successful lab value found.). ? ?COAG ?No results found for: INR, PROTIME ? ?Radiology ?No results found. ? ? ?Assessment/Plan ?1. Lymphedema ?Recommend: ? ?No surgery or intervention at this point in time.   ? ?I have reviewed my discussion with the patient regarding lymphedema and why it  causes symptoms.  Patient will continue wearing graduated compression on a daily basis. The patient should put the compression on first thing in the morning and removing them in the evening. The patient should not sleep in the compression.  ? ?In addition, behavioral modification throughout the day will be continued.  This will include frequent elevation (such as in a recliner), use of over the counter pain medications as needed and exercise such as walking. ? ?The systemic causes for chronic edema such as liver, kidney and cardiac etiologies does not appear to have significant changed over the past year.   ? ?The patient will continue aggressive use of the  lymph pump.  This will continue to improve the edema control and prevent sequela such as ulcers and infections.  ? ?The patient will follow-up with me on an annual basis.   ? ?2. Chronic venous insufficiency ?Recommend: ? ?No surgery or intervention at this point in time.   ? ?I have reviewed my discussion with the patient regarding lymphedema and why it  causes symptoms.  Patient will continue wearing graduated compression on a daily basis. The patient should put the compression on first  thing in the morning and removing them in the evening. The patient should not sleep in the compression.  ? ?In addition, behavioral modification throughout the day will be continued.  This will include frequent elevation (such as in a recliner), use of over the counter pain medications as needed and exercise such as walking. ? ?The systemic causes for chronic edema such as liver, kidney and cardiac etiologies does not appear to have significant changed over the past year.   ? ?The patient will continue aggressive use of the  lymph pump.  This will continue to improve the edema control and prevent sequela such as ulcers and infections.  ? ?The patient will follow-up with me on an annual basis.   ? ?3. Essential hypertension ?Continue antihypertensive medications as already ordered, these medications have been reviewed and there are no changes at this time.  ? ?4. Type 2 diabetes mellitus without complication, without long-term current use of insulin ([place]) ?  Continue hypoglycemic medications as already ordered, these medications have been reviewed and there are no changes at this time. ? ?Hgb A1C to be monitored as already arranged by primary service  ? ?5. Mixed hyperlipidemia ?Continue statin as ordered and reviewed, no changes at this time  ? ? ? ?Hortencia Pilar, MD ? ?03/22/2022 ?12:23 PM ? ?  ?

## 2022-09-27 ENCOUNTER — Encounter (INDEPENDENT_AMBULATORY_CARE_PROVIDER_SITE_OTHER): Payer: Self-pay

## 2023-03-25 ENCOUNTER — Ambulatory Visit (INDEPENDENT_AMBULATORY_CARE_PROVIDER_SITE_OTHER): Payer: Medicare PPO | Admitting: Nurse Practitioner

## 2023-03-25 ENCOUNTER — Encounter (INDEPENDENT_AMBULATORY_CARE_PROVIDER_SITE_OTHER): Payer: Self-pay | Admitting: Nurse Practitioner

## 2023-03-25 VITALS — BP 134/74 | HR 62 | Resp 18 | Ht 60.0 in | Wt 229.8 lb

## 2023-03-25 DIAGNOSIS — I890 Lymphedema, not elsewhere classified: Secondary | ICD-10-CM

## 2023-03-25 DIAGNOSIS — E119 Type 2 diabetes mellitus without complications: Secondary | ICD-10-CM | POA: Diagnosis not present

## 2023-03-25 DIAGNOSIS — E782 Mixed hyperlipidemia: Secondary | ICD-10-CM

## 2023-03-25 DIAGNOSIS — I10 Essential (primary) hypertension: Secondary | ICD-10-CM

## 2023-04-18 ENCOUNTER — Encounter (INDEPENDENT_AMBULATORY_CARE_PROVIDER_SITE_OTHER): Payer: Self-pay | Admitting: Nurse Practitioner

## 2023-04-18 NOTE — Progress Notes (Signed)
Subjective:    Patient ID: Meagan Martinez, female    DOB: 16-May-1946, 77 y.o.   MRN: [id number] Chief Complaint  Patient presents with   Follow-up    f/u in 1 year with no studies    The patient returns to the office for followup evaluation regarding leg swelling.  The swelling has persisted but with the lymph pump is under much, much better controlled. The pain associated with swelling is decreased. There have not been any interval development of a ulcerations or wounds.  The patient denies problems with the pump, noting it is working well and the leggings are in good condition.  Since the previous visit the patient has been wearing graduated compression stockings and using the lymph pump on a routine basis and  has noted significant improvement in the lymphedema.   Patient stated the lymph pump has been helpful with the treatment of the lymphedema.      Review of Systems  Cardiovascular:  Positive for leg swelling.  All other systems reviewed and are negative.      Objective:   Physical Exam Vitals reviewed.  HENT:     Head: Normocephalic.  Cardiovascular:     Rate and Rhythm: Normal rate.     Pulses: Normal pulses.  Pulmonary:     Effort: Pulmonary effort is normal.  Skin:    General: Skin is warm and dry.  Neurological:     Mental Status: She is alert and oriented to person, place, and time.  Psychiatric:        Mood and Affect: Mood normal.        Behavior: Behavior normal.        Thought Content: Thought content normal.        Judgment: Judgment normal.     BP 134/74 (BP Location: Left Arm)   Pulse 62   Resp 18   Ht 5' (1.524 m)   Wt 229 lb 12.8 oz (104.2 kg)   BMI 44.88 kg/m   Past Medical History:  Diagnosis Date   Asthma    Colon polyps    Diabetes mellitus without complication (HCC)    Family history of breast cancer    Family history of prostate cancer    Family history of stomach cancer    Hypertension     Social History    Socioeconomic History   Marital status: Married    Spouse name: Fayrene Fearing   Number of children: 1   Years of education: Not on file   Highest education level: Not on file  Occupational History   Not on file  Tobacco Use   Smoking status: Never   Smokeless tobacco: Never  Vaping Use   Vaping Use: Never used  Substance and Sexual Activity   Alcohol use: No   Drug use: No   Sexual activity: Not on file  Other Topics Concern   Not on file  Social History Narrative   Not on file   Social Determinants of Health   Financial Resource Strain: Not on file  Food Insecurity: Not on file  Transportation Needs: Not on file  Physical Activity: Not on file  Stress: Not on file  Social Connections: Not on file  Intimate Partner Violence: Not on file    History reviewed. No pertinent surgical history.  Family History  Problem Relation Age of Onset   Alzheimer's disease Mother    Lung cancer Father        smoker   Prostate cancer  Brother    Stomach cancer Paternal Aunt    Prostate cancer Brother    Stomach cancer Cousin        paternal first cousin    Allergies  Allergen Reactions   Wound Dressing Adhesive Rash   Tramadol Other (See Comments)    "zombie"        No data to display            CMP  No results found for: "NA", "K", "CL", "CO2", "GLUCOSE", "BUN", "CREATININE", "CALCIUM", "PROT", "ALBUMIN", "AST", "ALT", "ALKPHOS", "BILITOT", "GFRNONAA", "GFRAA"   No results found.     Assessment & Plan:   1. Lymphedema Recommend:  No surgery or intervention at this point in time.    I have reviewed my discussion with the patient regarding lymphedema and why it  causes symptoms.  Patient will continue wearing graduated compression on a daily basis. The patient should put the compression on first thing in the morning and removing them in the evening. The patient should not sleep in the compression.   In addition, behavioral modification throughout the day will be  continued.  This will include frequent elevation (such as in a recliner), use of over the counter pain medications as needed and exercise such as walking.  The systemic causes for chronic edema such as liver, kidney and cardiac etiologies does not appear to have significant changed over the past year.    The patient will continue aggressive use of the  lymph pump.  This will continue to improve the edema control and prevent sequela such as ulcers and infections.   The patient will follow-up with me on an annual basis.   2. Essential hypertension Continue antihypertensive medications as already ordered, these medications have been reviewed and there are no changes at this time.  3. Mixed hyperlipidemia Continue statin as ordered and reviewed, no changes at this time  4. Type 2 diabetes mellitus without complication, without long-term current use of insulin (HCC) Continue hypoglycemic medications as already ordered, these medications have been reviewed and there are no changes at this time.  Hgb A1C to be monitored as already arranged by primary service   Current Outpatient Medications on File Prior to Visit  Medication Sig Dispense Refill   acetaminophen (TYLENOL) 650 MG CR tablet Take 650 mg by mouth every 8 (eight) hours as needed for pain.     albuterol (VENTOLIN HFA) 108 (90 Base) MCG/ACT inhaler Inhale into the lungs.     allopurinol (ZYLOPRIM) 300 MG tablet Take 300 mg daily by mouth.     atorvastatin (LIPITOR) 40 MG tablet Take by mouth.     CALCIUM CITRATE-VITAMIN D3 PO Take 1 tablet by mouth daily.     cyanocobalamin 1000 MCG tablet Take by mouth.     Garlic 10 MG CAPS Take by mouth.     montelukast (SINGULAIR) 10 MG tablet Take 10 mg at bedtime by mouth.     Multiple Vitamin (MULTI-VITAMIN) tablet Take 1 tablet by mouth daily.     Omega-3 Fatty Acids (FISH OIL) 1200 MG CPDR Take by mouth.     omeprazole (PRILOSEC) 20 MG capsule Take 20 mg by mouth daily.     timolol  (BETIMOL) 0.25 % ophthalmic solution 1-2 drops 2 (two) times daily.     triamcinolone cream (KENALOG) 0.5 % Apply topically.     amoxicillin (AMOXIL) 875 MG tablet Take 1 tablet (875 mg total) 2 (two) times daily by mouth. (Patient not taking: Reported on  08/24/2021) 20 tablet 0   aspirin 81 MG tablet Take 81 mg by mouth daily. (Patient not taking: Reported on 03/25/2023)     benzonatate (TESSALON) 100 MG capsule Take 1 capsule (100 mg total) 3 (three) times daily as needed by mouth. (Patient not taking: Reported on 08/24/2021) 21 capsule 0   Biotin 5000 MCG CAPS Take by mouth. (Patient not taking: Reported on 03/22/2022)     colchicine 0.6 MG tablet Take 0.6 mg daily by mouth. (Patient not taking: Reported on 09/21/2021)     fluticasone (FLONASE) 50 MCG/ACT nasal spray Place into both nostrils daily. (Patient not taking: Reported on 03/22/2022)     HYDROcodone-acetaminophen (NORCO/VICODIN) 5-325 MG tablet 1-2 tabs po bid prn (Patient not taking: Reported on 09/21/2021) 10 tablet 0   metFORMIN (GLUCOPHAGE) 1000 MG tablet Take 500 mg by mouth 2 (two) times daily with a meal. (Patient not taking: Reported on 03/25/2023)     metFORMIN (GLUCOPHAGE-XR) 500 MG 24 hr tablet Take 500 mg daily with breakfast by mouth. (Patient not taking: Reported on 09/21/2021)     ofloxacin (OCUFLOX) 0.3 % ophthalmic solution Apply to eye. (Patient not taking: Reported on 03/25/2023)     ondansetron (ZOFRAN-ODT) 4 MG disintegrating tablet Take by mouth. (Patient not taking: Reported on 03/25/2023)     Potassium 99 MG TABS Take by mouth. (Patient not taking: Reported on 08/24/2021)     prednisoLONE acetate (PRED FORTE) 1 % ophthalmic suspension Apply to eye. (Patient not taking: Reported on 03/25/2023)     predniSONE (DELTASONE) 20 MG tablet 2 tabs po qd x 5 days (Patient not taking: Reported on 03/25/2023) 10 tablet 0   Turmeric Curcumin 500 MG CAPS Take by mouth 2 (two) times daily. (Patient not taking: Reported on 03/25/2023)      No current facility-administered medications on file prior to visit.    There are no Patient Instructions on file for this visit. No follow-ups on file.   Georgiana Spinner, NP

## 2024-03-26 ENCOUNTER — Encounter (INDEPENDENT_AMBULATORY_CARE_PROVIDER_SITE_OTHER): Payer: Self-pay | Admitting: Vascular Surgery

## 2024-03-26 ENCOUNTER — Ambulatory Visit (INDEPENDENT_AMBULATORY_CARE_PROVIDER_SITE_OTHER): Payer: Medicare PPO | Admitting: Vascular Surgery

## 2024-03-26 VITALS — BP 134/80 | HR 62 | Resp 18 | Ht 64.0 in | Wt 224.0 lb

## 2024-03-26 DIAGNOSIS — I890 Lymphedema, not elsewhere classified: Secondary | ICD-10-CM | POA: Diagnosis not present

## 2024-03-26 DIAGNOSIS — I10 Essential (primary) hypertension: Secondary | ICD-10-CM

## 2024-03-26 DIAGNOSIS — I872 Venous insufficiency (chronic) (peripheral): Secondary | ICD-10-CM

## 2024-03-26 DIAGNOSIS — E119 Type 2 diabetes mellitus without complications: Secondary | ICD-10-CM

## 2024-03-26 NOTE — Progress Notes (Signed)
 MRN : [id number]  Meagan Martinez is a 78 y.o. (12-18-1945) female who presents with chief complaint of legs swell.  History of Present Illness:    The patient returns to the office for followup evaluation regarding leg swelling.  The swelling has persisted but with the lymph pump the patient states the swelling is much better controlled. The pain associated with swelling is essentially eliminated. There have not been any interval development of a ulcerations or wounds.  No episodes of cellulitis or infection over the past 12 months  The patient denies problems with the pump, noting it is working well and the leggings are in good condition.  Since the previous visit the patient has been wearing graduated compression stockings and using the lymph pump on a routine basis and  has noted significant improvement in the lymphedema.   Patient stated the lymph pump has been helpful in treating the lymphedema.    Past Medical History:  Diagnosis Date   Asthma    Colon polyps    Diabetes mellitus without complication (HCC)    Family history of breast cancer    Family history of prostate cancer    Family history of stomach cancer    Hypertension     No past surgical history on file.  Social History Social History   Tobacco Use   Smoking status: Never   Smokeless tobacco: Never  Vaping Use   Vaping status: Never Used  Substance Use Topics   Alcohol use: No   Drug use: No    Family History Family History  Problem Relation Age of Onset   Alzheimer's disease Mother    Lung cancer Father        smoker   Prostate cancer Brother    Stomach cancer Paternal Aunt    Prostate cancer Brother    Stomach cancer Cousin        paternal first cousin    Allergies  Allergen Reactions   Wound Dressing Adhesive Rash   Tramadol Other (See Comments)    "zombie"     REVIEW OF SYSTEMS (Negative unless checked)  Constitutional: [] Weight loss  [] Fever   [] Chills Cardiac: [] Chest pain   [] Chest pressure   [] Palpitations   [] Shortness of breath when laying flat   [] Shortness of breath with exertion. Vascular:  [] Pain in legs with walking   [x] Pain in legs with standing  [] History of DVT   [] Phlebitis   [x] Swelling in legs   [] Varicose veins   [] Non-healing ulcers Pulmonary:   [] Uses home oxygen   [] Productive cough   [] Hemoptysis   [] Wheeze  [] COPD   [] Asthma Neurologic:  [] Dizziness   [] Seizures   [] History of stroke   [] History of TIA  [] Aphasia   [] Vissual changes   [] Weakness or numbness in arm   [] Weakness or numbness in leg Musculoskeletal:   [] Joint swelling   [] Joint pain   [] Low back pain Hematologic:  [] Easy bruising  [] Easy bleeding   [] Hypercoagulable state   [] Anemic Gastrointestinal:  [] Diarrhea   [] Vomiting  [] Gastroesophageal reflux/heartburn   [] Difficulty swallowing. Genitourinary:  [] Chronic kidney disease   [] Difficult urination  [] Frequent urination   [] Blood in urine Skin:  [] Rashes   [] Ulcers  Psychological:  [] History of anxiety   []  History of major depression.  Physical Examination  There were no vitals  filed for this visit. There is no height or weight on file to calculate BMI. Gen: WD/WN, NAD Head: [place]/AT, No temporalis wasting.  Ear/Nose/Throat: Hearing grossly intact, nares w/o erythema or drainage, pinna without lesions Eyes: PER, EOMI, sclera nonicteric.  Neck: Supple, no gross masses.  No JVD.  Pulmonary:  Good air movement, no audible wheezing, no use of accessory muscles.  Cardiac: RRR, precordium not hyperdynamic. Vascular:  scattered varicosities present bilaterally.  Mild venous stasis changes to the legs bilaterally.  2+ soft pitting edema, CEAP [id number]  Vessel Right Left  Radial Palpable Palpable  Gastrointestinal: soft, non-distended. No guarding/no peritoneal signs.  Musculoskeletal: M/S 5/5 throughout.  No deformity.  Neurologic: CN 2-12 intact. Pain and light touch intact in extremities.   Symmetrical.  Speech is fluent. Motor exam as listed above. Psychiatric: Judgment intact, Mood & affect appropriate for pt's clinical situation. Dermatologic: Venous rashes no ulcers noted.  No changes consistent with cellulitis. Lymph : No lichenification or skin changes of chronic lymphedema.  CBC No results found for: "WBC", "HGB", "HCT", "MCV", "PLT"  BMET No results found for: "NA", "K", "CL", "CO2", "GLUCOSE", "BUN", "CREATININE", "CALCIUM", "GFRNONAA", "GFRAA" CrCl cannot be calculated (No successful lab value found.).  COAG No results found for: "INR", "PROTIME"  Radiology No results found.   Assessment/Plan 1. Lymphedema (Primary) Recommend:  No surgery or intervention at this point in time.    I have reviewed my discussion with the patient regarding lymphedema and why it  causes symptoms.  Patient will continue wearing graduated compression on a daily basis. The patient should put the compression on first thing in the morning and removing them in the evening. The patient should not sleep in the compression.   In addition, behavioral modification throughout the day will be continued.  This will include frequent elevation (such as in a recliner), use of over the counter pain medications as needed and exercise such as walking.  The systemic causes for chronic edema such as liver, kidney and cardiac etiologies does not appear to have significant changed over the past year.    The patient will continue aggressive use of the  lymph pump.  This will continue to improve the edema control and prevent sequela such as ulcers and infections.   The patient will follow-up with me on an as needed basis.   2. Chronic venous insufficiency Recommend:  No surgery or intervention at this point in time.    I have reviewed my discussion with the patient regarding lymphedema and why it  causes symptoms.  Patient will continue wearing graduated compression on a daily basis. The patient  should put the compression on first thing in the morning and removing them in the evening. The patient should not sleep in the compression.   In addition, behavioral modification throughout the day will be continued.  This will include frequent elevation (such as in a recliner), use of over the counter pain medications as needed and exercise such as walking.  The systemic causes for chronic edema such as liver, kidney and cardiac etiologies does not appear to have significant changed over the past year.    The patient will continue aggressive use of the  lymph pump.  This will continue to improve the edema control and prevent sequela such as ulcers and infections.   The patient will follow-up with me on an as needed basis.   3. Essential hypertension Continue antihypertensive medications as already ordered, these medications have been reviewed and there are  no changes at this time.  4. Type 2 diabetes mellitus without complication, without long-term current use of insulin (HCC) Continue hypoglycemic medications as already ordered, these medications have been reviewed and there are no changes at this time.  Hgb A1C to be monitored as already arranged by primary service    Devon Fogo, MD  03/26/2024 12:31 PM

## 2024-03-31 ENCOUNTER — Encounter (INDEPENDENT_AMBULATORY_CARE_PROVIDER_SITE_OTHER): Payer: Self-pay | Admitting: Vascular Surgery

## 2024-04-17 ENCOUNTER — Encounter (INDEPENDENT_AMBULATORY_CARE_PROVIDER_SITE_OTHER): Payer: Self-pay
